# Patient Record
Sex: Male | Born: 1945 | Race: White | Hispanic: No | Marital: Single | Smoking: Never smoker
Health system: Southern US, Community
[De-identification: ages and names within clinical notes are randomized; demographics above are authoritative.]

## PROBLEM LIST (undated history)

## (undated) DIAGNOSIS — I119 Hypertensive heart disease without heart failure: Secondary | ICD-10-CM

## (undated) DIAGNOSIS — I2510 Atherosclerotic heart disease of native coronary artery without angina pectoris: Secondary | ICD-10-CM

## (undated) DIAGNOSIS — I5022 Chronic systolic (congestive) heart failure: Secondary | ICD-10-CM

## (undated) DIAGNOSIS — I639 Cerebral infarction, unspecified: Secondary | ICD-10-CM

## (undated) DIAGNOSIS — E119 Type 2 diabetes mellitus without complications: Secondary | ICD-10-CM

## (undated) DIAGNOSIS — E785 Hyperlipidemia, unspecified: Secondary | ICD-10-CM

## (undated) DIAGNOSIS — I255 Ischemic cardiomyopathy: Secondary | ICD-10-CM

## (undated) DIAGNOSIS — R42 Dizziness and giddiness: Secondary | ICD-10-CM

## (undated) DIAGNOSIS — I10 Essential (primary) hypertension: Secondary | ICD-10-CM

## (undated) HISTORY — PX: CORONARY ANGIOPLASTY WITH STENT PLACEMENT: SHX49

## (undated) HISTORY — DX: Hyperlipidemia, unspecified: E78.5

## (undated) HISTORY — DX: Cerebral infarction, unspecified: I63.9

## (undated) HISTORY — PX: KNEE CARTILAGE SURGERY: SHX688

## (undated) HISTORY — DX: Essential (primary) hypertension: I10

## (undated) HISTORY — PX: KNEE SURGERY: SHX244

---

## 2009-06-12 HISTORY — PX: CARDIAC CATHETERIZATION: SHX172

## 2009-11-11 ENCOUNTER — Inpatient Hospital Stay: Payer: Self-pay | Admitting: Internal Medicine

## 2011-06-13 HISTORY — PX: CARDIAC CATHETERIZATION: SHX172

## 2011-09-16 ENCOUNTER — Inpatient Hospital Stay: Payer: Self-pay | Admitting: Internal Medicine

## 2011-09-16 LAB — COMPREHENSIVE METABOLIC PANEL
Albumin: 4.3 g/dL (ref 3.4–5.0)
Alkaline Phosphatase: 107 U/L (ref 50–136)
Bilirubin,Total: 1 mg/dL (ref 0.2–1.0)
Chloride: 104 mmol/L (ref 98–107)
Co2: 24 mmol/L (ref 21–32)
Creatinine: 1.09 mg/dL (ref 0.60–1.30)
EGFR (Non-African Amer.): >60
Potassium: 3.7 mmol/L (ref 3.5–5.1)
SGOT(AST): 37 U/L (ref 15–37)
Sodium: 140 mmol/L (ref 136–145)
Total Protein: 8 g/dL (ref 6.4–8.2)

## 2011-09-16 LAB — CBC
HCT: 41.3 % (ref 40.0–52.0)
HGB: 13.3 g/dL (ref 13.0–18.0)
MCHC: 32.3 g/dL (ref 32.0–36.0)
MCV: 89 fL (ref 80–100)
Platelet: 154 10*3/uL (ref 150–440)
RDW: 14 % (ref 11.5–14.5)
WBC: 7.7 10*3/uL (ref 3.8–10.6)

## 2011-09-16 LAB — CK TOTAL AND CKMB (NOT AT ARMC)
CK, Total: 136 U/L (ref 35–232)
CK-MB: 3.9 ng/mL — ABNORMAL HIGH (ref 0.5–3.6)
CK-MB: 3.5 ng/mL (ref 0.5–3.6)

## 2011-09-16 LAB — PRO B NATRIURETIC PEPTIDE: B-Type Natriuretic Peptide: 4078 pg/mL — ABNORMAL HIGH (ref 0–125)

## 2011-09-16 LAB — TROPONIN I: Troponin-I: 0.02 ng/mL

## 2011-09-17 DIAGNOSIS — R079 Chest pain, unspecified: Secondary | ICD-10-CM

## 2011-09-17 DIAGNOSIS — I5023 Acute on chronic systolic (congestive) heart failure: Secondary | ICD-10-CM

## 2011-09-17 DIAGNOSIS — I059 Rheumatic mitral valve disease, unspecified: Secondary | ICD-10-CM

## 2011-09-17 LAB — BASIC METABOLIC PANEL
Calcium, Total: 8.9 mg/dL (ref 8.5–10.1)
Co2: 30 mmol/L (ref 21–32)
EGFR (Non-African Amer.): >60
Glucose: 109 mg/dL — ABNORMAL HIGH (ref 65–99)
Potassium: 3.5 mmol/L (ref 3.5–5.1)

## 2011-09-17 LAB — CBC WITH DIFFERENTIAL/PLATELET
Basophil #: 0 10*3/uL (ref 0.0–0.1)
Basophil %: 0.5 %
Eosinophil #: 0.1 10*3/uL (ref 0.0–0.7)
HCT: 34.5 % — ABNORMAL LOW (ref 40.0–52.0)
Lymphocyte #: 1.7 10*3/uL (ref 1.0–3.6)
Lymphocyte %: 27.8 %
MCHC: 32.7 g/dL (ref 32.0–36.0)
MCV: 89 fL (ref 80–100)
Monocyte #: 0.5 10*3/uL (ref 0.0–0.7)
Monocyte %: 8.1 %
Neutrophil #: 3.8 10*3/uL (ref 1.4–6.5)
Neutrophil %: 61.9 %
RDW: 14.8 % — ABNORMAL HIGH (ref 11.5–14.5)

## 2011-09-17 LAB — CK TOTAL AND CKMB (NOT AT ARMC): CK, Total: 132 U/L (ref 35–232)

## 2011-09-17 LAB — LIPID PANEL
Cholesterol: 126 mg/dL (ref 0–200)
HDL Cholesterol: 26 mg/dL — ABNORMAL LOW (ref 40–60)
Triglycerides: 99 mg/dL (ref 0–200)

## 2011-09-17 LAB — TROPONIN I: Troponin-I: 0.03 ng/mL

## 2011-09-17 LAB — MAGNESIUM: Magnesium: 1.9 mg/dL

## 2011-09-18 DIAGNOSIS — I2510 Atherosclerotic heart disease of native coronary artery without angina pectoris: Secondary | ICD-10-CM

## 2011-09-19 LAB — BASIC METABOLIC PANEL
Anion Gap: 9 (ref 7–16)
BUN: 15 mg/dL (ref 7–18)
Calcium, Total: 8.6 mg/dL (ref 8.5–10.1)
Chloride: 104 mmol/L (ref 98–107)
EGFR (African American): >60
EGFR (Non-African Amer.): >60
Osmolality: 283 (ref 275–301)
Potassium: 3.8 mmol/L (ref 3.5–5.1)

## 2011-10-05 ENCOUNTER — Encounter: Payer: Self-pay | Admitting: Cardiovascular Disease

## 2011-10-13 ENCOUNTER — Encounter: Payer: Self-pay | Admitting: Cardiovascular Disease

## 2012-05-20 ENCOUNTER — Emergency Department: Payer: Self-pay | Admitting: Emergency Medicine

## 2012-05-20 LAB — BASIC METABOLIC PANEL
BUN: 14 mg/dL (ref 7–18)
Calcium, Total: 8.9 mg/dL (ref 8.5–10.1)
Chloride: 107 mmol/L (ref 98–107)
Creatinine: 0.93 mg/dL (ref 0.60–1.30)
EGFR (African American): >60
EGFR (Non-African Amer.): >60
Glucose: 202 mg/dL — ABNORMAL HIGH (ref 65–99)
Potassium: 4.1 mmol/L (ref 3.5–5.1)
Sodium: 137 mmol/L (ref 136–145)

## 2012-05-20 LAB — HEPATIC FUNCTION PANEL A (ARMC)
Alkaline Phosphatase: 99 U/L (ref 50–136)
SGPT (ALT): 25 U/L (ref 12–78)

## 2012-05-20 LAB — CBC
HCT: 41.3 % (ref 40.0–52.0)
MCH: 30.7 pg (ref 26.0–34.0)
MCHC: 33.7 g/dL (ref 32.0–36.0)
Platelet: 179 10*3/uL (ref 150–440)
RBC: 4.54 10*6/uL (ref 4.40–5.90)
RDW: 15.4 % — ABNORMAL HIGH (ref 11.5–14.5)

## 2012-05-20 LAB — CK TOTAL AND CKMB (NOT AT ARMC): CK, Total: 130 U/L (ref 35–232)

## 2012-05-20 LAB — TROPONIN I: Troponin-I: 0.03 ng/mL

## 2012-05-27 ENCOUNTER — Inpatient Hospital Stay: Payer: Self-pay | Admitting: Internal Medicine

## 2012-05-27 LAB — CBC
HCT: 43.5 % (ref 40.0–52.0)
HGB: 14.5 g/dL (ref 13.0–18.0)
MCHC: 33.3 g/dL (ref 32.0–36.0)
MCV: 90 fL (ref 80–100)
Platelet: 174 10*3/uL (ref 150–440)

## 2012-05-27 LAB — URINALYSIS, COMPLETE
Bilirubin,UR: NEGATIVE
Glucose,UR: NEGATIVE mg/dL (ref 0–75)
Ketone: NEGATIVE
Protein: 100
RBC,UR: 1 /HPF (ref 0–5)
Specific Gravity: 1.016 (ref 1.003–1.030)
WBC UR: 5 /HPF (ref 0–5)

## 2012-05-27 LAB — COMPREHENSIVE METABOLIC PANEL
Albumin: 3.7 g/dL (ref 3.4–5.0)
Alkaline Phosphatase: 96 U/L (ref 50–136)
BUN: 19 mg/dL — ABNORMAL HIGH (ref 7–18)
Bilirubin,Total: 0.8 mg/dL (ref 0.2–1.0)
Co2: 26 mmol/L (ref 21–32)
Creatinine: 1.04 mg/dL (ref 0.60–1.30)
Glucose: 165 mg/dL — ABNORMAL HIGH (ref 65–99)
Osmolality: 274 (ref 275–301)
Potassium: 3.9 mmol/L (ref 3.5–5.1)
Sodium: 134 mmol/L — ABNORMAL LOW (ref 136–145)
Total Protein: 8 g/dL (ref 6.4–8.2)

## 2012-05-27 LAB — PRO B NATRIURETIC PEPTIDE: B-Type Natriuretic Peptide: 4147 pg/mL — ABNORMAL HIGH (ref 0–125)

## 2012-05-28 DIAGNOSIS — I2510 Atherosclerotic heart disease of native coronary artery without angina pectoris: Secondary | ICD-10-CM

## 2012-05-28 DIAGNOSIS — R0602 Shortness of breath: Secondary | ICD-10-CM

## 2012-05-28 DIAGNOSIS — I517 Cardiomegaly: Secondary | ICD-10-CM

## 2012-05-28 LAB — TROPONIN I: Troponin-I: <0.02 ng/mL

## 2012-05-29 LAB — BASIC METABOLIC PANEL
Anion Gap: 6 — ABNORMAL LOW (ref 7–16)
Calcium, Total: 9 mg/dL (ref 8.5–10.1)
Co2: 29 mmol/L (ref 21–32)
EGFR (African American): >60
Glucose: 132 mg/dL — ABNORMAL HIGH (ref 65–99)
Osmolality: 274 (ref 275–301)

## 2012-06-12 HISTORY — PX: TOTAL HIP ARTHROPLASTY: SHX124

## 2012-07-04 ENCOUNTER — Encounter: Payer: Self-pay | Admitting: Cardiovascular Disease

## 2012-07-04 ENCOUNTER — Ambulatory Visit (INDEPENDENT_AMBULATORY_CARE_PROVIDER_SITE_OTHER): Payer: Self-pay | Admitting: Cardiovascular Disease

## 2012-07-04 VITALS — BP 162/98 | HR 91 | Ht 77.0 in | Wt 307.5 lb

## 2012-07-04 DIAGNOSIS — R05 Cough: Secondary | ICD-10-CM

## 2012-07-04 DIAGNOSIS — I2510 Atherosclerotic heart disease of native coronary artery without angina pectoris: Secondary | ICD-10-CM

## 2012-07-04 DIAGNOSIS — R0789 Other chest pain: Secondary | ICD-10-CM

## 2012-07-04 DIAGNOSIS — R0602 Shortness of breath: Secondary | ICD-10-CM

## 2012-07-04 DIAGNOSIS — I5042 Chronic combined systolic (congestive) and diastolic (congestive) heart failure: Secondary | ICD-10-CM | POA: Insufficient documentation

## 2012-07-04 DIAGNOSIS — R059 Cough, unspecified: Secondary | ICD-10-CM

## 2012-07-04 DIAGNOSIS — I10 Essential (primary) hypertension: Secondary | ICD-10-CM

## 2012-07-04 DIAGNOSIS — I509 Heart failure, unspecified: Secondary | ICD-10-CM

## 2012-07-04 DIAGNOSIS — I5022 Chronic systolic (congestive) heart failure: Secondary | ICD-10-CM

## 2012-07-04 DIAGNOSIS — E785 Hyperlipidemia, unspecified: Secondary | ICD-10-CM

## 2012-07-04 MED ORDER — CARVEDILOL 25 MG PO TABS: 25.0000 mg | ORAL_TABLET | Freq: Two times a day (BID) | ORAL | Status: DC

## 2012-07-04 MED ORDER — LISINOPRIL 40 MG PO TABS: 40.0000 mg | ORAL_TABLET | Freq: Every day | ORAL | Status: DC

## 2012-07-04 MED ORDER — METFORMIN HCL 500 MG PO TABS: 500.0000 mg | ORAL_TABLET | Freq: Two times a day (BID) | ORAL | Status: DC

## 2012-07-04 MED ORDER — FUROSEMIDE 40 MG PO TABS: 80.0000 mg | ORAL_TABLET | Freq: Two times a day (BID) | ORAL | Status: DC

## 2012-07-04 NOTE — Assessment & Plan Note (Signed)
Currently with no symptoms of angina. No further workup at this time. Continue current medication regimen. 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

## 2012-07-04 NOTE — Patient Instructions (Addendum)
Please take afternoon lasix at 2 pm Ok to take lasix 80 mg in the AM, extra lasix in the afternoon  Double coreg and take 25 mg twice a day  Increase lisinopril to 40 mg daily  Call if the cough does not improve  Please call us if you have new issues that need to be addressed before your next appt.  Your physician wants you to follow-up in: 3 months.  You will receive a reminder letter in the mail two months in advance. If you don't receive a letter, please call our office to schedule the follow-up appointment.

## 2012-07-04 NOTE — Progress Notes (Signed)
Patient ID: [name], male    DOB: August 28, 1945, 68 y.o.   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 Prior echocardiogram showing ejection fraction less than 25%. Poorly controlled diabetes  He had presented to the emergency room one week earlier for shortness of breath malaise. Started on Lasix. He reports no benefit to the [place] because he earns too much. He had significant diuresis in the hospital, started back on beta blocker and ACE inhibitor With improvement of his symptoms. He was also started on antibiotics for possible chronic bronchitis.   He presents today and reports that he continues to feel poorly. He has a persistent cough, white phlegm .He denies any edema or abdominal swelling. He has general malaise. Significant stress as his mother had a recent fall with severe leg fracture.) Close friend has lung cancer  BNP in the hospital was 4100 Echocardiogram in the hospital 05/28/2012 shows ejection fraction 15%, moderate LVH, severe global hypokinesis, moderately dilated left atrium  EKG shows normal sinus rhythm with rate 81 beats per minute no significant ST or T wave changes    Outpatient Encounter Prescriptions as of 07/04/2012  Medication Sig Dispense Refill  . aspirin 81 MG tablet Take 81 mg by mouth daily.      Marland Kitchen glimepiride (AMARYL) 4 MG tablet Take 4 mg by mouth daily before breakfast.      . linagliptin  (TRADJENTA) 5 MG TABS tablet Take 5 mg by mouth daily.      . potassium chloride (K-DUR) 10 MEQ tablet Take 10 mEq by mouth daily.      . pravastatin (PRAVACHOL) 20 MG tablet Take 20 mg by mouth daily.      . carvedilol (COREG) 3.125 MG tablet Take 12.5 mg by mouth 2 (two) times daily with a meal.       .  furosemide (LASIX) 80 MG tablet Take 40 mg by mouth 2 (two) times daily.      Marland Kitchen  lisinopril (PRINIVIL,ZESTRIL) 2.5 MG tablet Take 10 mg by mouth daily.       . metFORMIN (GLUCOPHAGE) 500 MG tablet Take 1 tablet (500 mg total) by mouth 2 (two) times daily with a meal.  60 tablet  11    Review of Systems  Constitutional: Positive for fatigue.  HENT: Negative.   Eyes: Negative.   Respiratory: Positive for shortness of breath.   Cardiovascular: Negative.   Gastrointestinal: Negative.   Musculoskeletal: Negative.   Skin: Negative.   Neurological: Positive for weakness.  Hematological: Negative.   Psychiatric/Behavioral: Negative.   All other systems reviewed and are negative.     BP 162/98  Pulse 91  Ht 6\' 5"  (1.956 m)  Wt 307 lb 8 oz (139.481 kg)  BMI 36.46 kg/m2  Physical Exam  Nursing note and vitals reviewed. Constitutional: He is oriented to person, place, and time. He appears well-developed and well-nourished.  HENT:  Head: Normocephalic.  Nose: Nose normal.  Mouth/Throat: Oropharynx is clear and moist.  Eyes: Conjunctivae normal are normal.  Pupils are equal, round, and reactive to light.  Neck: Normal range of motion. Neck supple. No JVD present.  Cardiovascular: Normal rate, regular rhythm, S1 normal, S2 normal, normal heart sounds and intact distal pulses.  Exam reveals no gallop and no friction rub.   No murmur heard. Pulmonary/Chest: Effort normal and breath sounds normal. No respiratory distress. He has no wheezes. He has no rales. He exhibits no tenderness.  Abdominal: Soft. Bowel sounds are normal. He exhibits no distension. There is no tenderness.    Musculoskeletal: Normal range of motion. He exhibits no edema and no tenderness.  Lymphadenopathy:    He has no cervical adenopathy.  Neurological: He is alert and oriented to person, place, and time. Coordination normal.  Skin: Skin is warm and dry. No rash noted. No erythema.  Psychiatric: He has a normal mood and affect. His behavior is normal. Judgment and thought content normal.           Assessment and Plan

## 2012-07-04 NOTE — Assessment & Plan Note (Signed)
Symptomatic shortness of breath. Continued cough concerning for heart failure. We have suggested he increase Lasix to 80 mg in the morning and 40 mg in the early afternoon. If no significant improvement in symptoms, we have recommended he take Lasix 80 mg twice a day. If he continues to have symptoms after that, other option will be to change to torsemide 40 mg twice a day.   We'll increase his carvedilol to 25 mg twice a day. Heart rate is mildly elevated. Blood pressures high. We'll also increase lisinopril to 40 mg daily.

## 2012-07-04 NOTE — Assessment & Plan Note (Signed)
Shortness of breath likely from heart failure. No smoking history. He is overweight and deconditioned.

## 2012-07-04 NOTE — Assessment & Plan Note (Signed)
Uncertain if cough is secondary to heart failure. If no improvement in cough with diuresis, may need to do a trial hold off the ACE inhibitor.

## 2012-07-04 NOTE — Assessment & Plan Note (Signed)
Carvedilol increased and lisinopril increased for better blood pressure control. Increase diuresis should also help blood pressure.

## 2012-07-05 ENCOUNTER — Encounter: Payer: Self-pay | Admitting: *Deleted

## 2012-10-02 ENCOUNTER — Ambulatory Visit (INDEPENDENT_AMBULATORY_CARE_PROVIDER_SITE_OTHER): Payer: Medicare Other | Admitting: Cardiovascular Disease

## 2012-10-02 ENCOUNTER — Encounter: Payer: Self-pay | Admitting: Cardiovascular Disease

## 2012-10-02 VITALS — BP 169/108 | HR 69 | Ht 77.0 in | Wt 311.0 lb

## 2012-10-02 DIAGNOSIS — I2510 Atherosclerotic heart disease of native coronary artery without angina pectoris: Secondary | ICD-10-CM

## 2012-10-02 DIAGNOSIS — I10 Essential (primary) hypertension: Secondary | ICD-10-CM

## 2012-10-02 DIAGNOSIS — R42 Dizziness and giddiness: Secondary | ICD-10-CM

## 2012-10-02 DIAGNOSIS — I5022 Chronic systolic (congestive) heart failure: Secondary | ICD-10-CM

## 2012-10-02 DIAGNOSIS — I509 Heart failure, unspecified: Secondary | ICD-10-CM

## 2012-10-02 NOTE — Patient Instructions (Addendum)
You are doing well. No medication changes were made.  Please monitor you weight If you get worsening shortness of breath or edema, cut back on fluid intake, increase the diuretic  Please call us if you have new issues that need to be addressed before your next appt.  Your physician wants you to follow-up in: 6 months.  You will receive a reminder letter in the mail two months in advance. If you don't receive a letter, please call our office to schedule the follow-up appointment.

## 2012-10-02 NOTE — Progress Notes (Signed)
Patient ID: [name], male    DOB: 09-10-1945, 67 y.o.   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 Prior echocardiogram showing ejection fraction less than 25%. Poorly controlled diabetes  He had presented to the emergency room previously  for shortness of breath malaise. Started on Lasix. He reports no benefit to the [place] because he earns too much. He had significant diuresis in the hospital, started back on beta blocker and ACE inhibitor With improvement of his symptoms. He was also started on antibiotics for possible chronic bronchitis.   He reports significant stress recently. Mother passed away from complications after a fall, leg fracture and infection in her abdomen. Friends died of lung cancer. He continues to work, drinking more during the daytime. Weight is up several pounds but no significant edema and shortness of breath is stable.  Overall is trying to adjust to loss of his mother.  BNP in the hospital was 4100 Echocardiogram in the hospital 05/28/2012 shows ejection fraction 15%, moderate LVH, severe global hypokinesis, moderately dilated left atrium  EKG shows normal sinus rhythm with rate 69 beats per minute no significant ST or T wave changes    Outpatient Encounter Prescriptions as of 10/02/2012  Medication Sig Dispense Refill  . aspirin 81 MG tablet Take 81 mg by mouth daily.      . carvedilol (COREG)  25 MG tablet Take 1 tablet (25 mg total) by mouth 2 (two) times daily with a meal.  180 tablet  3  . furosemide (LASIX) 40 MG tablet Take 2 tablets (80 mg total) by mouth 2 (two) times daily.  360 tablet  3  . glimepiride (AMARYL) 4 MG tablet Take 4 mg by mouth daily before breakfast.      . linagliptin (TRADJENTA) 5 MG TABS tablet Take 5 mg by mouth daily.      Marland Kitchen lisinopril (PRINIVIL,ZESTRIL) 40 MG tablet Take 1 tablet (40 mg total) by mouth daily.  90 tablet  3  . metFORMIN (GLUCOPHAGE) 500 MG tablet Take 1 tablet (500 mg total) by mouth 2 (two) times daily with a meal.  60 tablet  11  . potassium chloride (K-DUR) 10 MEQ tablet Take 10 mEq by mouth daily.      . pravastatin (PRAVACHOL) 20 MG tablet Take 20 mg by mouth daily.       No facility-administered encounter medications on file as of 10/02/2012.     Review of Systems  HENT: Negative.   Eyes: Negative.   Cardiovascular: Negative.   Gastrointestinal: Negative.   Musculoskeletal: Negative.   Skin: Negative.   Psychiatric/Behavioral: Positive for dysphoric mood.  All other systems reviewed and are negative.     BP 169/108  Pulse 69  Ht 6\' 5"  (1.956 m)  Wt 311 lb (141.069 kg)  BMI 36.87 kg/m2  Physical Exam  Nursing note and vitals reviewed. Constitutional: He is oriented to person, place, and time. He appears well-developed and well-nourished.  HENT:  Head: Normocephalic.  Nose: Nose normal.  Mouth/Throat: Oropharynx  is clear and moist.  Eyes: Conjunctivae are normal. Pupils are equal, round, and reactive to light.  Neck: Normal range of motion. Neck supple. No JVD present.  Cardiovascular: Normal rate, regular rhythm, S1 normal, S2 normal, normal heart sounds and intact distal pulses.  Exam reveals no gallop and no friction rub.   No murmur heard. Pulmonary/Chest: Effort normal and breath sounds normal. No respiratory distress. He has no wheezes. He has no rales. He exhibits no tenderness.  Abdominal: Soft. Bowel  sounds are normal. He exhibits no distension. There is no tenderness.  Musculoskeletal: Normal range of motion. He exhibits no edema and no tenderness.  Lymphadenopathy:    He has no cervical adenopathy.  Neurological: He is alert and oriented to person, place, and time. Coordination normal.  Skin: Skin is warm and dry. No rash noted. No erythema.  Psychiatric: He has a normal mood and affect. His behavior is normal. Judgment and thought content normal.      Assessment and Plan

## 2012-10-02 NOTE — Assessment & Plan Note (Signed)
Currently with no symptoms of angina. No further workup at this time. Continue current medication regimen. 

## 2012-10-02 NOTE — Assessment & Plan Note (Signed)
Weight is essentially stable. We have suggested he stay on his aggressive diuretic regimen, limit his fluid intake. He is drinking significant beer.

## 2012-10-02 NOTE — Assessment & Plan Note (Signed)
Blood pressure is high but he suspects this is from recent stressors. He is tearful at times in the office today after loss of his mother. We will continue to monitor his blood pressure closely.

## 2012-10-14 ENCOUNTER — Telehealth: Payer: Self-pay

## 2012-10-14 NOTE — Telephone Encounter (Signed)
Pt cleared

## 2012-10-14 NOTE — Telephone Encounter (Signed)
Pt is having hip replacement in the next couple weeks, and needs surgical clearance. Pt was seen last on 10/02/2012. Please call

## 2012-10-15 NOTE — Telephone Encounter (Signed)
Acceptable risk Would minimized IVF in periop and post op period as EF is less than 20% May be best to involve cardiology post op

## 2012-10-16 NOTE — Telephone Encounter (Signed)
Pt informed letter will be placed at [facility] for pick up

## 2013-04-02 ENCOUNTER — Ambulatory Visit: Payer: Medicare Other | Admitting: Cardiovascular Disease

## 2013-04-07 ENCOUNTER — Ambulatory Visit (INDEPENDENT_AMBULATORY_CARE_PROVIDER_SITE_OTHER): Payer: Medicare Other | Admitting: Cardiovascular Disease

## 2013-04-07 ENCOUNTER — Encounter: Payer: Self-pay | Admitting: Cardiovascular Disease

## 2013-04-07 VITALS — BP 190/116 | HR 98 | Ht 77.0 in | Wt 304.5 lb

## 2013-04-07 DIAGNOSIS — I2510 Atherosclerotic heart disease of native coronary artery without angina pectoris: Secondary | ICD-10-CM

## 2013-04-07 DIAGNOSIS — I10 Essential (primary) hypertension: Secondary | ICD-10-CM

## 2013-04-07 DIAGNOSIS — I509 Heart failure, unspecified: Secondary | ICD-10-CM

## 2013-04-07 DIAGNOSIS — E785 Hyperlipidemia, unspecified: Secondary | ICD-10-CM

## 2013-04-07 DIAGNOSIS — I5022 Chronic systolic (congestive) heart failure: Secondary | ICD-10-CM

## 2013-04-07 DIAGNOSIS — F1010 Alcohol abuse, uncomplicated: Secondary | ICD-10-CM

## 2013-04-07 MED ORDER — PRAVASTATIN SODIUM 40 MG PO TABS: 40.0000 mg | ORAL_TABLET | Freq: Every day | ORAL | Status: DC

## 2013-04-07 MED ORDER — AMLODIPINE BESYLATE 10 MG PO TABS: 10.0000 mg | ORAL_TABLET | Freq: Every day | ORAL | Status: DC

## 2013-04-07 NOTE — Assessment & Plan Note (Signed)
Currently with no symptoms of angina. No further workup at this time. Continue current medication regimen. 

## 2013-04-07 NOTE — Assessment & Plan Note (Signed)
In remission for the past month

## 2013-04-07 NOTE — Assessment & Plan Note (Addendum)
Blood pressure is elevated today, 180 systolic on my recheck. We will start amlodipine 10 mg daily. Continue Coreg and lisinopril.

## 2013-04-07 NOTE — Progress Notes (Signed)
Patient ID: [name], male    DOB: 20-Feb-1946, 67 y.o.   MRN: [id number]  HPI Comments: 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 Prior echocardiogram showing ejection fraction less than 25%. Poorly controlled diabetes in the past Reports today that he has not been drinking in the past month. 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  presented to the emergency room previously  for shortness of breath malaise. Started on Lasix. He reports no benefits at the [place] because he earns too much. He had significant diuresis in the hospital, started back on beta blocker and ACE inhibitor With improvement of his symptoms.   He reports significant stress recently. Mother passed away from complications after a fall, leg fracture and infection in her abdomen. Friend died of lung cancer. He has had a significant problem with drinking in the past, has not had a drink in the last one month Doing well after his total hip replacement Blood pressure is up today, he does not check it at home. Recent lab work showing hemoglobin A1c 6.5, total cholesterol 231, LDL 160  BNP in the hospital was 4100 Echocardiogram in the hospital 05/28/2012 shows ejection fraction 15%, moderate LVH, severe global hypokinesis, moderately dilated left atrium (presumably depressed from alcohol)   EKG shows normal sinus rhythm with rate 98 beats per minute no significant ST or T wave changes    Outpatient Encounter Prescriptions as of 04/07/2013  Medication Sig Dispense Refill  . aspirin 81  MG tablet Take 81 mg by mouth daily.      . carvedilol (COREG) 25 MG tablet Take 1 tablet (25 mg total) by mouth 2 (two) times daily with a meal.  180 tablet  3  . furosemide (LASIX) 40 MG tablet Take 2 tablets (80 mg total) by mouth 2 (two) times daily.  360 tablet  3  . glimepiride (AMARYL) 4 MG tablet Take 4 mg by mouth daily before breakfast.      . HYDROcodone-acetaminophen (NORCO) 10-325 MG per tablet Take 1 tablet by mouth daily.       Marland Kitchen linagliptin (TRADJENTA) 5 MG TABS tablet Take 5 mg by mouth daily.      Marland Kitchen lisinopril (PRINIVIL,ZESTRIL) 40 MG tablet Take 1 tablet (40 mg total) by mouth daily.  90 tablet  3  . metFORMIN (GLUCOPHAGE) 500 MG tablet Take 1 tablet (500 mg total) by mouth 2 (two) times daily with a meal.  60 tablet  11  . potassium chloride (K-DUR) 10 MEQ tablet Take 10 mEq by mouth daily.      . pravastatin (PRAVACHOL) 20 MG tablet Take 20 mg by mouth daily.       No facility-administered encounter medications on file as of 04/07/2013.     Review of Systems  Constitutional: Negative.   HENT: Negative.   Eyes: Negative.   Respiratory: Negative.   Cardiovascular: Negative.   Gastrointestinal: Negative.   Endocrine: Negative.   Musculoskeletal: Negative.   Skin: Negative.   Allergic/Immunologic: Negative.   Neurological: Negative.   Hematological: Negative.   Psychiatric/Behavioral: Positive for dysphoric mood.  All other systems reviewed and  are negative.     BP 190/116  Pulse 98  Ht 6\' 5"  (1.956 m)  Wt 304 lb 8 oz (138.12 kg)  BMI 36.1 kg/m2  Physical Exam  Nursing note and vitals reviewed. Constitutional: He is oriented to person, place, and time. He appears well-developed and well-nourished.  HENT:  Head: Normocephalic.  Nose: Nose normal.  Mouth/Throat: Oropharynx is clear and moist.  Eyes: Conjunctivae are normal. Pupils are equal, round, and reactive to light.  Neck: Normal range of motion. Neck supple. No JVD present.  Cardiovascular: Normal  rate, regular rhythm, S1 normal, S2 normal, normal heart sounds and intact distal pulses.  Exam reveals no gallop and no friction rub.   No murmur heard. Pulmonary/Chest: Effort normal and breath sounds normal. No respiratory distress. He has no wheezes. He has no rales. He exhibits no tenderness.  Abdominal: Soft. Bowel sounds are normal. He exhibits no distension. There is no tenderness.  Musculoskeletal: Normal range of motion. He exhibits no edema and no tenderness.  Lymphadenopathy:    He has no cervical adenopathy.  Neurological: He is alert and oriented to person, place, and time. Coordination normal.  Skin: Skin is warm and dry. No rash noted. No erythema.  Psychiatric: He has a normal mood and affect. His behavior is normal. Judgment and thought content normal.      Assessment and Plan

## 2013-04-07 NOTE — Assessment & Plan Note (Signed)
Encouraged him to stay on his diuretics and other medications. Appears relatively euvolemic

## 2013-04-07 NOTE — Patient Instructions (Addendum)
Blood pressure is elevated today  Please make sure you are taking: lisinopril 40 mg one a day Take coreg 25 mg twice a day  Please start amlodipine 10 mg once a day Please monitor your blood pressure  Please continue on your pravastatin  Please call us if you have new issues that need to be addressed before your next appt.  Your physician wants you to follow-up in: 2 months.

## 2013-04-07 NOTE — Assessment & Plan Note (Signed)
We will increase his pravastatin to 40 mg daily. Cholesterol well above goal. Goal LDL less than 70.

## 2013-06-10 ENCOUNTER — Ambulatory Visit: Payer: Medicare Other | Admitting: Cardiovascular Disease

## 2013-06-12 HISTORY — PX: LASIK: SHX215

## 2013-06-23 ENCOUNTER — Ambulatory Visit: Payer: Medicare Other | Admitting: Cardiovascular Disease

## 2013-08-06 ENCOUNTER — Encounter: Payer: Self-pay | Admitting: Cardiovascular Disease

## 2013-08-06 ENCOUNTER — Ambulatory Visit: Payer: Medicare Other | Admitting: Physician Assistant

## 2013-08-06 ENCOUNTER — Ambulatory Visit (INDEPENDENT_AMBULATORY_CARE_PROVIDER_SITE_OTHER): Payer: Medicare Other | Admitting: Cardiovascular Disease

## 2013-08-06 VITALS — BP 152/100 | HR 105 | Ht 77.0 in | Wt 312.5 lb

## 2013-08-06 DIAGNOSIS — I2510 Atherosclerotic heart disease of native coronary artery without angina pectoris: Secondary | ICD-10-CM

## 2013-08-06 DIAGNOSIS — I5022 Chronic systolic (congestive) heart failure: Secondary | ICD-10-CM

## 2013-08-06 DIAGNOSIS — R0602 Shortness of breath: Secondary | ICD-10-CM

## 2013-08-06 DIAGNOSIS — I509 Heart failure, unspecified: Secondary | ICD-10-CM

## 2013-08-06 DIAGNOSIS — E785 Hyperlipidemia, unspecified: Secondary | ICD-10-CM

## 2013-08-06 DIAGNOSIS — M549 Dorsalgia, unspecified: Secondary | ICD-10-CM

## 2013-08-06 DIAGNOSIS — F1010 Alcohol abuse, uncomplicated: Secondary | ICD-10-CM

## 2013-08-06 DIAGNOSIS — I10 Essential (primary) hypertension: Secondary | ICD-10-CM

## 2013-08-06 MED ORDER — CARVEDILOL 25 MG PO TABS: 25.0000 mg | ORAL_TABLET | Freq: Two times a day (BID) | ORAL | Status: DC

## 2013-08-06 MED ORDER — LISINOPRIL 40 MG PO TABS: 40.0000 mg | ORAL_TABLET | Freq: Every day | ORAL | Status: DC

## 2013-08-06 MED ORDER — PRAVASTATIN SODIUM 40 MG PO TABS: 40.0000 mg | ORAL_TABLET | Freq: Every day | ORAL | Status: DC

## 2013-08-06 MED ORDER — CYCLOBENZAPRINE HCL 10 MG PO TABS
10.0000 mg | ORAL_TABLET | Freq: Three times a day (TID) | ORAL | Status: DC | PRN
Start: 2013-08-06 — End: 2014-03-16

## 2013-08-06 NOTE — Assessment & Plan Note (Signed)
Blood pressure very elevated. Has not been taking his medications. We will   re\represcribe all his treat his medications

## 2013-08-06 NOTE — Progress Notes (Signed)
Patient ID: Thomas FillersGary Bender, male    DOB: 09/21/1945, 68 y.o.   MRN: [id number]  HPI Comments: 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 Prior echocardiogram showing ejection fraction less than 25%. Poorly controlled diabetes in the past s/p total hip replacement on left   presented to the emergency room previously  for shortness of breath malaise. Started on Lasix. [place] because [place] too much. He had significant diuresis in the hospital, started back on beta blocker and ACE inhibitor With improvement of his symptoms.   Previous significant stress. Mother passed away from complications after a fall, leg fracture and infection in her abdomen. Friend died of lung cancer. He has had a significant problem with drinking in the past,    In followup today, he reports that last week he fell and hurt his back and he has had significant pain. His back is in spasm today. He did go to see the chiropractor today. He is having trouble sitting on the exam table without discomfort. Also reports that he has run out of money. Receive social security check today. He has not been taking his medications on a regular basis  Previous lab work showing hemoglobin A1c 6.5, total cholesterol 231, LDL 160  BNP in the hospital was 4100 Echocardiogram in the hospital 05/28/2012 shows ejection fraction 15%, moderate LVH, severe global hypokinesis, moderately dilated left atrium (presumably depressed from alcohol)   EKG shows normal sinus rhythm with rate 105 beats  per minute no significant ST or T wave changes    Outpatient Encounter Prescriptions as of 08/06/2013  Medication Sig  . amLODipine (NORVASC) 10 MG tablet Take 1 tablet (10 mg total) by mouth daily.  Marland Kitchen. aspirin 81 MG tablet Take 81 mg by mouth daily.  . carvedilol (COREG) 25 MG tablet Take 1 tablet (25 mg total) by mouth 2 (two) times daily with a meal.  . furosemide (LASIX) 40 MG tablet Take 2 tablets (80 mg total) by mouth 2 (two) times daily.  Marland Kitchen. glimepiride (AMARYL) 4 MG tablet Take 4 mg by mouth daily before breakfast.  . HYDROcodone-acetaminophen (NORCO) 10-325 MG per tablet Take 1 tablet by mouth daily.   Marland Kitchen. linagliptin (TRADJENTA) 5 MG TABS tablet Take 5 mg by mouth daily.  Marland Kitchen. lisinopril (PRINIVIL,ZESTRIL) 40 MG tablet Take 1 tablet (40 mg total) by mouth daily.  . metFORMIN (GLUCOPHAGE) 500 MG tablet Take 1 tablet (500 mg total) by mouth 2 (two) times daily with a meal.  . potassium chloride (K-DUR) 10 MEQ tablet Take 10 mEq by mouth daily.  . pravastatin (PRAVACHOL) 40 MG tablet Take 1 tablet (40 mg total) by mouth daily.     Review of Systems  Constitutional: Negative.   HENT: Negative.   Eyes: Negative.   Respiratory: Negative.   Cardiovascular: Negative.   Gastrointestinal: Negative.   Endocrine: Negative.   Musculoskeletal: Positive for back pain.  Skin: Negative.   Allergic/Immunologic: Negative.   Neurological: Negative.   Hematological: Negative.   Psychiatric/Behavioral: Positive for dysphoric mood.  All other systems reviewed and are negative.    BP 152/100  Pulse 105  Ht 6\' 5"  (1.956 m)  Wt 312 lb 8 oz (141.749 kg)  BMI 37.05 kg/m2  Physical Exam  Nursing note and vitals reviewed. Constitutional: He is oriented to person, place, and time. He appears well-developed and well-nourished.  Minimal range of motion given his severe back pain. Having spasm on the table.  HENT:  Head: Normocephalic.  Nose: Nose normal.  Mouth/Throat: Oropharynx is clear and  moist.  Eyes: Conjunctivae are normal. Pupils are equal, round, and reactive to light.  Neck: Normal range of motion. Neck supple. No JVD present.  Cardiovascular: Normal rate, regular rhythm, S1 normal, S2 normal, normal heart sounds and intact distal pulses.  Exam reveals no gallop and no friction rub.   No murmur heard. Pulmonary/Chest: Effort normal and breath sounds normal. No respiratory distress. He has no wheezes. He has no rales. He exhibits no tenderness.  Abdominal: Soft. Bowel sounds are normal. He exhibits no distension. There is no tenderness.  Musculoskeletal: He exhibits no edema and no tenderness.  Lymphadenopathy:    He has no cervical adenopathy.  Neurological: He is alert and oriented to person, place, and time. Coordination normal.  Skin: Skin is warm and dry. No rash noted. No erythema.  Psychiatric: Judgment and thought content normal.      Assessment and Plan

## 2013-08-06 NOTE — Assessment & Plan Note (Signed)
Alcohol abuse not discussed with him today

## 2013-08-06 NOTE — Assessment & Plan Note (Signed)
Currently with no symptoms of angina. No further workup at this time. Continue previous medication regiment We will restart his medications as he's not taking anything

## 2013-08-06 NOTE — Patient Instructions (Addendum)
For back pain: Take naproxen 500 mg 1 to 2 pills twice a day as needed for inflammation Take flexeril 10 mg as needed up to three times a day  As needed for muscle relaxer Ok to take tramadol twice a day for pain  For blood mpressure: Take coreg 25 mg twice a day Also take lisinopril once a day Cut the amlodipine in 1/2 if you have headache  Restart aspirin  Please call us if you have new issues that need to be addressed before your next appt.  Your physician wants you to follow-up in: 6 months.  You will receive a reminder letter in the mail two months in advance. If you don't receive a letter, please call our office to schedule the follow-up appointment.

## 2013-08-06 NOTE — Assessment & Plan Note (Signed)
We have offered Flexeril and tramadol for his discomfort. Suggested he followup closely with primary care

## 2013-08-06 NOTE — Assessment & Plan Note (Signed)
Appears to be relatively euvolemic. Currently not taking medications. We'll restart his previous outpatient medications He does not want a diuretic

## 2013-08-06 NOTE — Telephone Encounter (Signed)
This encounter was created in error - please disregard.

## 2013-08-06 NOTE — Assessment & Plan Note (Signed)
Suggested he restart his statin

## 2013-08-13 ENCOUNTER — Ambulatory Visit: Payer: Self-pay | Admitting: Family Medicine

## 2014-01-29 ENCOUNTER — Emergency Department: Payer: Self-pay | Admitting: Emergency Medicine

## 2014-02-27 ENCOUNTER — Ambulatory Visit: Payer: Medicare Other | Admitting: Cardiovascular Disease

## 2014-03-16 ENCOUNTER — Ambulatory Visit (INDEPENDENT_AMBULATORY_CARE_PROVIDER_SITE_OTHER): Payer: Medicare Other | Admitting: Cardiovascular Disease

## 2014-03-16 ENCOUNTER — Encounter: Payer: Self-pay | Admitting: Cardiovascular Disease

## 2014-03-16 VITALS — BP 190/104 | HR 110 | Ht 77.0 in | Wt 317.5 lb

## 2014-03-16 DIAGNOSIS — I2510 Atherosclerotic heart disease of native coronary artery without angina pectoris: Secondary | ICD-10-CM

## 2014-03-16 DIAGNOSIS — I5022 Chronic systolic (congestive) heart failure: Secondary | ICD-10-CM

## 2014-03-16 DIAGNOSIS — M5489 Other dorsalgia: Secondary | ICD-10-CM

## 2014-03-16 DIAGNOSIS — I10 Essential (primary) hypertension: Secondary | ICD-10-CM

## 2014-03-16 DIAGNOSIS — R0602 Shortness of breath: Secondary | ICD-10-CM

## 2014-03-16 DIAGNOSIS — E785 Hyperlipidemia, unspecified: Secondary | ICD-10-CM

## 2014-03-16 MED ORDER — AMLODIPINE BESYLATE 10 MG PO TABS: 10.0000 mg | ORAL_TABLET | Freq: Every day | ORAL | Status: AC

## 2014-03-16 MED ORDER — CARVEDILOL 25 MG PO TABS: 25.0000 mg | ORAL_TABLET | Freq: Two times a day (BID) | ORAL | Status: DC

## 2014-03-16 MED ORDER — LISINOPRIL 40 MG PO TABS: 40.0000 mg | ORAL_TABLET | Freq: Every day | ORAL | Status: DC

## 2014-03-16 MED ORDER — PRAVASTATIN SODIUM 40 MG PO TABS: 40.0000 mg | ORAL_TABLET | Freq: Every day | ORAL | Status: DC

## 2014-03-16 MED ORDER — FUROSEMIDE 40 MG PO TABS: 80.0000 mg | ORAL_TABLET | Freq: Two times a day (BID) | ORAL | Status: DC | PRN

## 2014-03-16 MED ORDER — POTASSIUM CHLORIDE ER 10 MEQ PO TBCR: 10.0000 meq | EXTENDED_RELEASE_TABLET | Freq: Two times a day (BID) | ORAL | Status: DC | PRN
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
## 2014-03-16 NOTE — Assessment & Plan Note (Signed)
Recommended he restart his Lasix for weight gain and shortness of breath. Take with potassium

## 2014-03-16 NOTE — Patient Instructions (Signed)
You are doing well.  For blood pressure Please restart amlodipine one a day, Restart coreg 25 mg twice a day Stay on lisinopril one a day  Please restart pravastatin one a day for cholesterol  Please call us if you have new issues that need to be addressed before your next appt.  Your physician wants you to follow-up in: 6 months.  You will receive a reminder letter in the mail two months in advance. If you don't receive a letter, please call our office to schedule the follow-up appointment.

## 2014-03-16 NOTE — Assessment & Plan Note (Signed)
Currently with no symptoms of angina. No further workup at this time. Continue current medication regimen. 
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
## 2014-03-16 NOTE — Assessment & Plan Note (Signed)
We have recommended that he restart his amlodipine, carvedilol and stay on lisinopril. He did not take his lisinopril today. All of his medications have been refilled

## 2014-03-16 NOTE — Assessment & Plan Note (Signed)
Overall appears relatively euvolemic though does report some shortness of breath when supine. We will renew his Lasix and recommended he take this for weight gain or symptoms of shortness of breath

## 2014-03-16 NOTE — Assessment & Plan Note (Signed)
We have recommended he restart his pravastatin.

## 2014-07-13 DIAGNOSIS — H43812 Vitreous degeneration, left eye: Secondary | ICD-10-CM | POA: Diagnosis not present

## 2014-07-13 DIAGNOSIS — Z961 Presence of intraocular lens: Secondary | ICD-10-CM | POA: Diagnosis not present

## 2014-08-31 DIAGNOSIS — E119 Type 2 diabetes mellitus without complications: Secondary | ICD-10-CM | POA: Diagnosis not present

## 2014-08-31 DIAGNOSIS — H43812 Vitreous degeneration, left eye: Secondary | ICD-10-CM | POA: Diagnosis not present

## 2014-08-31 DIAGNOSIS — Z961 Presence of intraocular lens: Secondary | ICD-10-CM | POA: Diagnosis not present

## 2014-09-08 DIAGNOSIS — M7651 Patellar tendinitis, right knee: Secondary | ICD-10-CM | POA: Diagnosis not present

## 2014-09-08 DIAGNOSIS — M1711 Unilateral primary osteoarthritis, right knee: Secondary | ICD-10-CM | POA: Diagnosis not present
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
## 2014-10-02 NOTE — Discharge Summary (Signed)
PATIENT NAME:  [name], [name] MR#:  [id number] DATE OF BIRTH:  [birth year]  DATE OF ADMISSION:  05/27/2012 DATE OF DISCHARGE:  05/29/2012  PRIMARY CARE PHYSICIAN: None.   DISCHARGE DIAGNOSES: Acute on chronic systolic congestive heart failure, acute bronchitis.   HISTORY OF PRESENTING ILLNESS: A 69 year old male with past medical history of coronary artery disease, 60% blockage in the left LAD, severe congestive heart failure, ejection fraction 25% as per previous records. He has no primary care physician because of financial issues and he cannot afford to go to any doctor. He was in the Emergency Room 1 week ago of this admission with a similar complaint. The emergency physician gave him prescription for the medications which are cheap, available in the local pharmacies and which can help him in CHF. He claims that he took the medication regularly as it was prescribed for the last 5 to 6 days, but there was no improvement in his symptoms. He has to keep sitting in bed all the time. If he lies down, he feels like drowning in water within 5 to 10 minutes. He denied any chest pain with feeling severe shortness of breath. Denied any wheezing and had minimal swelling of the legs, so he was admitted with the possibility of having worsening of his heart failure.   HOSPITAL COURSE AND STAY: His congestive heart failure was managed with IV Lasix, Coreg, lisinopril, spironolactone and serial troponin was followed and echocardiogram was done for further workup. After treatment with IV Lasix, he felt a little better, and finally, echocardiogram was done which showed ejection fraction of 15%. The plan was to discharge him the next day of admission, but because of the finding on echocardiogram and he was still feeling a little short of breath, so decided to keep him 1 more day in the hospital for total improvement. Cardiology consult was done for optimization of the medication, and they suggested possibility of needing  AICD in the future, but before that, he has to take 2 to 3 months of proper medications to optimize his cardiac status.   OTHER MEDICAL ISSUES ADDRESSED DURING THE HOSPITAL STAY:  1. Hypertension. We gave him Coreg, lisinopril and spirolactone, and blood pressure remained under control.  2. Diabetes mellitus. We kept him on insulin sliding scale. We stopped metformin because of CHF, and we gave a prescription for glipizide.  3. Hyperlipidemia. Gave him Pravachol.  4. He was also complaining of orthostasis as when he tries to get up suddenly, he feels a little dizzy, but when he stands in the same position for 1 to 2 seconds, after that he feels okay, so we found out that this was because of the diuresis. He had more than 2 liter diuresis in 1 day, and blood pressure was borderline low. I explained to him that he needs all of his medications because of very severe heart failure, and he understands and agreed for getting up slowly from lying down position and keep holding something until he feels okay.   IMPORTANT LAB RESULTS: On presentation, glucose 165, BUN 19, creatinine 1.04, sodium 134, potassium 3.9, chloride 101, CO2 26. WBC 11.2, hemoglobin 14.5, platelet count 174. Troponin less than 0.02. BNP 4147. Echocardiogram result summary: Left ventricular systolic function is severely reduced. Ejection fraction estimated to be 15%. Transmitral spectrum Doppler flow pattern is suggestive of impaired LV relaxation. There is moderate concentric left ventricular hypertrophy. Severe global hypokinesis of the left ventricle. The right ventricle systolic function is normal. The left atrium  is moderately dilated, and right ventricle systolic pressure is normal  CONDITION ON DISCHARGE: Stable.   MEDICATIONS ADVISED ON DISCHARGE: Pravachol 20 mg oral tablet once a day, metformin 500 mg oral tablet 2 times a day, Klor-Con 10 mg oral tablet extended-release once a day, Lasix 80 mg oral tablet 0.5 tablet orally 2 times  a day, lisinopril 0.5 mg oral tablet once a day, carvedilol 3.125 mg oral tablet 2 times a day, aspirin 81 mg chewable tablet once a day, prednisone 10 mg oral tablet once a day, ProAir HFA inhale 1 puff 4 times a day as needed.   DIET: Low sodium. Consistency regular.   TIMEFRAME TO FOLLOWUP: Within 1 to 2 weeks with primary care physician.   TOTAL TIME SPENT: 45 minutes on the discharge:    ____________________________ Hope PigeonVaibhavkumar G. Elisabeth PigeonVachhani, MD [id number] D: 06/01/2012 22:58:31 ET T: 06/02/2012 23:50:20 ET JOB#: [id number]  cc: Hope PigeonVaibhavkumar G. Elisabeth PigeonVachhani, MD, <Dictator> Altamese DillingVAIBHAVKUMAR Shavon Zenz MD ELECTRONICALLY SIGNED 06/24/2012 23:25

## 2014-10-02 NOTE — H&P (Signed)
PATIENT NAME:  [name], [name] MR#:  [id number] DATE OF BIRTH:  [birth year]   PRIMARY CARE PHYSICIAN: None.   CHIEF COMPLAINT:  Shortness of breath, severe orthopnea.   HISTORY OF PRESENT ILLNESS:  This is a 69 year old male with past medical history of coronary artery disease, 60% blockage in the left LAD, severe congestive heart failure, ejection fraction 25% as per the previous records; has no primary care physician because of financial issues. 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 He does not have insurance or does not have money also. Last week he was here in the Emergency Room with a similar complaint and the ER physician gave him the medication prescription under the affordable plan which he purchased from [facility]. He claims that he was taking it regularly as prescribed since the last 5 to 6 days, but there is no improvement in his symptoms. He has to keep sitting in the bed all the time.  If he lies down, he said that within 5 to 10 minutes he will get drowned in the fluids. He denies any chest pain, but feeling severe shortness of breath because of these symptoms. Denies any wheezing and has minimal swelling on the legs. He also has some tingling and numbness in his right leg and pins and needles sensation associated with it. Other than that, he denies all other complaints.   REVIEW OF SYSTEMS:   CONSTITUTIONAL SYMPTOMS: Fever, fatigue, weakness, all denied by patient.   EYES: Denies blurring, double vision, pain or redness.   ENT: Denies any tinnitus, ear pain, hearing loss.   RESPIRATORY: Has severe shortness of breath if he lies down flat on the bed; has to be sitting up, but denies any wheezing or hemoptysis or cough.  CARDIOVASCULAR: Denies any chest pain. Has severe orthopnea and mild edema on the legs, but denies any arrhythmia or palpitations or syncopal episodes.    GASTROINTESTINAL: Denies any nausea, vomiting, diarrhea, abdominal pain or hematemesis.   GENITOURINARY: Denies dysuria, hematuria, increased frequency or incontinence.  ENDOCRINE: Denies polyuria, nocturia, or heat and cold intolerance.   SKIN: Denies any rashes or acne or lesions to the skin.   MUSCULOSKELETAL: Denies any pain in any joints.    NEUROLOGICAL: Denies any weakness but as described above, he has pins and needles sensation in the right foot. He denies any headache or tremors.   PSYCHIATRIC: Denies any anxiety, insomnia or psychiatric problems.   PAST MEDICAL HISTORY:   Positive for diabetes, hypertension, coronary artery disease, hyperlipidemia and cerebrovascular accident without any residual weakness.   PAST SURGICAL HISTORY: Left knee surgery, left hip surgery, right hip and knee cartilage surgery.  SOCIAL HISTORY:  He lives at home. No history of any smoking, and he drinks 2 to 3 beers a week. Denies any drug use, and he is a Cytogeneticistveteran.  FAMILY HISTORY:  Significant for angina in his father and borderline diabetes. Mother is relatively healthy.  PHYSICAL EXAMINATION: VITAL SIGNS: Temperature 98.9, pulse rate 86, respirations 22, blood pressure 136/81 and saturation 95% on room air.  GENERAL: He is fully alert, oriented and cooperative to history taking and physical examination. He is in mild distress and upset about the problems he is facing since almost 8 to 10 days and also because he is not able to get access in the [facility] system.  HEENT: Head and neck  atraumatic. Conjunctivae pink. Oral mucosa moist.  NECK: Supple. No JVD.  CARDIOVASCULAR: S1, S2 present, regular.  RESPIRATORY: Bilateral equal air entry.  A few crepitations heard at the bases. No rhonchi.  ABDOMEN: Soft, nontender. Bowel sounds present. No organomegaly appreciated.  LIMBS: Minimal edema around ankles.  SKIN: No rashes.  NEUROLOGICAL: Grossly normal power 5 out of 5 in all four limbs. No tremors or  rigidity.  PSYCHIATRIC: Does not appear in any acute psychiatric problem at this point of time.   LABORATORY AND DIAGNOSTIC DATA:   BNP 4147.  Glucose 152, BUN 19, creatinine 1.04, sodium 134, potassium 3.9, chloride 101 and CO2 of 26, calcium 9.3, total protein 8, albumin 3.7, bilirubin 0.8, alkaline phosphatase 96, SGOT 24, SGPT 21. Troponin less than 0.02. WBC 11.2, hemoglobin 14.5, hematocrit 43.5, platelet count is 774, MCV 90. Chest x-ray: Cardiomegaly, but not signs of pulmonary edema on chest x-ray. Compared to last week also, it was the same. EKG:  Normal sinus rhythm, possible left atrial enlargement.   ASSESSMENT AND PLAN:  1.  Acute systolic heart failure or it may be some pericardial effusion which is chronic and contributing to the symptoms, as there are no lung findings on chest x-ray, but he has an elevated BNP and he has severe orthopnea which are severely symptomatic signs of congestive heart failure. We will give him intravenous Lasix, Coreg, lisinopril, spironolactone and follow serial troponin. We will do echocardiogram in the morning to see the ejection fraction and to see for any pericardial effusion. Cardiology consult.  2.  Hypertension. We will give him Coreg and lisinopril and spironolactone. 3.  Diabetes mellitus. We will put him on insulin with sliding scale at this time.  4.  Hyperlipidemia. We will give him Pravachol.  5.  Deep venous thrombosis and gastrointestinal prophylaxis.  6.  Care management consult for financial issues as they might help him to get into the [place] system or to help him to get a primary care physician.   TOTAL TIME SPENT:  55 minutes.   CODE STATUS: Full code    ____________________________ Hope Pigeon Elisabeth Pigeon, MD vgv:ct D: 05/27/2012 21:59:00 ET T: 05/28/2012 07:29:05 ET JOB#: [id number]  cc: Hope Pigeon. Elisabeth Pigeon, MD, <Dictator> Altamese Dilling MD ELECTRONICALLY SIGNED 06/24/2012 23:21

## 2014-10-04 NOTE — Consult Note (Signed)
General Aspect 69 year old Caucasian male with past medical history significant for coronary artery disease status post cardiac catheterization in June 2011 showing 90% LAD stenosis status post stent placement, congestive heart failure with systolic dysfunction with an ejection fraction of 35 to 40% two years ago, history of hypertension and diabetes who has been noncompliant with follow up ("no PMD"),  presents to the hospital complaining of 2 to 3 month history of dyspnea, orthopnea, and also on and off chest pain, but progressively getting worse over the last 3 to 4 days. Cardiology was consulted for acute on chronic systolic CHF and chest pain.  short of breath on exertion for the past 2 to 3 months with worsening pedal edema adn ABD swelling.  chest pain in teh past month with exertion. He has had worsening orthopnea and PND. He reports having to  wake up in the middle of night and sit by the side of the bed to take deep breaths. He feels like he is  "drowning in water".  last discharge in June 2011 with no doctor follow up. About a month ago, he saw Dr. Brunetta Genera for possible bronchitis and was put on some antibiotic and also other medications.  He does report significant coffee and water intake.    PAST MEDICAL HISTORY:  1. Diabetes mellitus.  2. Hypertension.  3. Coronary artery disease status post LAD stent in June 2011, to LAD 4. Hyperlipidemia.  5. History of cerebrovascular accident with no residual weakness.    Present Illness . PAST SURGICAL HISTORY:  1. Left knee surgery. 2. Left hip surgery.  3. Right knee cartilage surgery.   DRUG ALLERGIES: No known drug allergies.   CURRENT HOME MEDICATIONS: no recent cardiac meds per the patient. Was given some medsd last month for bronchitis.  SOCIAL HISTORY: He lives at home with mother. No history of any smoking. He used to drink a lot of alcohol, but has cut down to a 6-pack of beer per week. Lots of coffee and water  intake  FAMILY HISTORY: Significant for angina in Dad and borderline diabetes. Mother is relatively healthy.   Physical Exam:   GEN well developed, well nourished, no acute distress, obese    HEENT red conjunctivae    NECK supple     RESP normal resp effort  clear BS     CARD Regular rate and rhythm  No murmur     ABD denies tenderness  soft     LYMPH negative neck    EXTR negative edema    SKIN normal to palpation    NEURO cranial nerves intact, motor/sensory function intact    PSYCH alert, A+O to time, place, person, good insight   Review of Systems:   Subjective/Chief Complaint SOB, chest tightness    General: Fatigue     Skin: No Complaints     ENT: No Complaints     Eyes: No Complaints     Neck: No Complaints     Respiratory: Frequent cough  Short of breath     Cardiovascular: Chest pain or discomfort  with exertion     Gastrointestinal: No Complaints     Genitourinary: No Complaints     Vascular: No Complaints     Musculoskeletal: No Complaints     Neurologic: No Complaints     Hematologic: No Complaints     Endocrine: No Complaints     Psychiatric: No Complaints     Review of Systems: All other systems were reviewed and  found to be negative     Medications/Allergies Reviewed Medications/Allergies reviewed           Admit Diagnosis:   ACUTE CHF: 17-Sep-2011, Active, ACUTE CHF      Admit Reason:   Acute CHF: (428.0) Active, ICD9, Congestive heart failure, unspecified    Cardiac:  06-Apr-13 09:49    Troponin I 0.02   CK, Total 130   CPK-MB, Serum 3.9  Routine Hem:  06-Apr-13 09:49    WBC (CBC) 7.7   RBC (CBC) 4.64   Hemoglobin (CBC) 13.3   Hematocrit (CBC) 41.3   Platelet Count (CBC) 154   MCV 89   MCH 28.8   MCHC 32.3   RDW 14.0  Routine Chem:  06-Apr-13 09:49    Glucose, Serum 188   BUN 14   Creatinine (comp) 1.09   Sodium, Serum 140   Potassium, Serum 3.7   Chloride, Serum 104   CO2, Serum 24   Calcium (Total),  Serum 9.2  Hepatic:  06-Apr-13 09:49    Bilirubin, Total 1.0   Alkaline Phosphatase 107   SGPT (ALT) 44   SGOT (AST) 37   Total Protein, Serum 8.0   Albumin, Serum 4.3  Routine Chem:  06-Apr-13 09:49    Osmolality (calc) 285   eGFR (African American) >60   eGFR (Non-African American) >60   Anion Gap 12   B-Type Natriuretic Peptide [facility]) 4078  Routine Coag:  06-Apr-13 09:49    D-Dimer, Quantitative 1.83  Cardiac:  06-Apr-13 18:13    Troponin I 0.03   CK, Total 136   CPK-MB, Serum 3.5   EKG:   Interpretation Sinus tachycardia with rate 114 bpm, Poor R wave progression, unable to exclude old anterior MI   Radiology Results:  XRay:    06-Apr-13 10:04, Chest PA and Lateral   Chest PA and Lateral    REASON FOR EXAM:    Chest Pain  COMMENTS:       PROCEDURE: DXR - DXR CHEST PA (OR AP) AND LATERAL  - Sep 16 2011 10:04AM     RESULT: Comparison: 11/11/2009    Findings:  Cardiomegaly and the mediastinum are similar to prior. No focal pulmonary   opacities.    IMPRESSION:   No acute cardio pulmonary disease.        Verified By: Gregor Hams, M.D., MD  Cardiology:    07-Apr-13 06:20, Echo Doppler   Echo Doppler    Interpretation Summary    Sinus tachycardia noted. The left ventricle is mildly dilated. Left   ventricular systolic function is moderate to severely reduced.   Ejection Fraction = <25%. The transmitral spectral Doppler flow   pattern is suggestiveof impaired LV relaxation. There is moderate   to severe global hypokinesis of the left ventricle. The right   ventricle is mildly dilated. The right ventricular systolic function   is mildly reduced. The left atrium is moderately dilated. The right   atrium is mildly dilated. There is mild to moderate mitral   regurgitation. There is mild to moderate tricuspid regurgitation.   Right ventricular systolic pressure is elevated at 40-40mHg.   Moderate scerlosis and leaflet thickening, annular calcification.     PatientHeight: 193 cm    PatientWeight: 1983kg    SystolicPressure: 1382mmHg    DiastolicPressure: 87 mmHg    HeartRate: 109 bpm    BSA: 2.6 m2    Procedure:    A two-dimensional transthoracic echocardiogram with color flow and  Doppler was performed.    Left Ventricle    Ejection Fraction = <25%.    The transmitral spectral Doppler flow pattern is suggestive of   impaired LV relaxation.    There is moderate to severe global hypokinesis of the left ventricle.    The left ventricle ismildly dilated.    There is normal left ventricular wall thickness.    Left ventricular systolic function is moderate to severely reduced.    Right Ventricle    The right ventricle is mildly dilated.    The right ventricular systolic function is mildly reduced.    Atria    The left atrium is moderately dilated.    The right atrium is mildly dilated.    There is no Doppler evidence for an atrial septal defect.    Mitral Valve    Calcified mitral apparatus.    There is no mitral valve stenosis.    There is mild to moderate mitral regurgitation.    Tricuspid Valve    There is mild to moderate tricuspid regurgitation.    Right ventricular systolic pressure is elevated at 40-48mHg.    Aortic Valve    Moderate scerlosis and leaflet thickening, annular calcification.    No hemodynamically significant valvular aortic stenosis.    No aortic regurgitation is present.    Pulmonic Valve    The pulmonic valve is not well visualized.    Trace pulmonic valvular regurgitation.    Great Vessels    The aortic root is normal size.    The pulmonary is not well visualized.    Pericardium/Pleural    There is no pleural effusion.    No pericardial effusion.    MMode 2D Measurements and Calculations    RVDd: 4.7 cm    IVSd: 0.97 cm    LVIDd: 6.1 cm    LVIDs: 4.6 cm    LVPWd: 1.1 cm    FS: 25 %    EF(Teich): 49 %    EPSS: 2.2 cm    Ao root diam: 3.4 cm    ACS: 1.5 cm    LA dimension: 5.1 cm     LVOT diam: 2.5 cm    Doppler Measurements and Calculations    MV E point: 86 cm/sec    MV A point: 127 cm/sec  MV E/A: 0.68     MV P1/2t max vel: 117 cm/sec    MV P1/2t: 74 msec    MVA(P1/2t): 3.0 cm2    MV dec slope: 464 cm/sec2    Ao V2 max: 166 cm/sec    Ao max PG: 11 mmHg    Ao V2 mean: 122 cm/sec    Ao mean PG: 7.0 mmHg    Ao V2 VTI: 25 cm    AVA(V,D): 1.7 cm2    LV max PG: 1.0 mmHg    LV V1 max: 57 cm/sec    MR max vel: 497 cm/sec    MR max PG: 99 mmHg    PA V2 max: 62 cm/sec    PA max PG: 2.0 mmHg    TR Max vel: 281 cm/sec    TR Max PG: 32 mmHg    RVSP: 42 mmHg    RAP systole: 10 mmHg    Reading Physician: GIda Rogue  Sonographer: TBerline Lopes, RDCS  Interpreting Physician:  TIda Rogue  electronically signed on   09-17-2011 11:59:56  Requesting Physician: GIda Rogue CT:    06-Apr-13 16:04, CT Chest for Pulm Embolism With Contrast  CT Chest for Pulm Embolism With Contrast    REASON FOR EXAM:    dyspnea  COMMENTS:       PROCEDURE: CT  - CT CHEST (FOR PE) W  - Sep 16 2011  4:04PM     RESULT: Comparison: 11/11/2009    Technique: Multiple thin section axial images were obtained from the lung   apices to the upper abdomen following 100 ml Isovue 370 intravenous   contrast, according to the PE protocol. These images were also reviewed   on a Siemens multiplanar work station.    Findings:   No mediastinal, maxillary, or hilar lymphadenopathy. There are several   small lymph nodes in the mediastinum, which are not pathologically     enlarged. Calcifications are seen in the coronary arteries. There is a   trace amount of perihepatic ascites. There is a trace right pleural   effusion. There is a small exophytic low-attenuation lesion in the   interpolar region of the right kidney which is indeterminate by density   on today's study. Previously it measured density of water but has   slightly increased in size. It measures 16 mm in diameter.  The cardiac   silhouette is enlarged.    The thoracic aorta is normal in caliber. No pulmonary embolus identified.    Mild groundglass opacities in the lower lobes may be related atelectasis   or pulmonary edema.    No aggressive lytic or sclerotic osseous lesions are identified.    IMPRESSION:   1. No pulmonary embolus identified. There are coronary artery   calcifications.  2. Mild groundglass opacities at the lung bases may be related to   atelectasis or pulmonary edema.  3. Trace right pleural effusion and trace amount of perihepatic ascites,   which is nonspecific.  4. Small low attenuation lesion in the right kidney likely represents a   small hemorrhagic or proteinaceous cyst, but is indeterminate. Followup   multiphasic contrast-enhanced renal CT is recommended for further   characterization.          Verified By: Gregor Hams, M.D., MD    NKA: None  Vital Signs/Nurse's Notes:  **Vital Signs.:   07-Apr-13 11:40   Vital Signs Type Routine   Temperature Temperature (F) 97.3   Celsius 36.2   Temperature Source oral   Pulse Pulse 96   Pulse source per Dinamap   Respirations Respirations 18   Systolic BP Systolic BP 622   Diastolic BP (mmHg) Diastolic BP (mmHg) 83   Mean BP 98   BP Source Dinamap   Pulse Ox % Pulse Ox % 96   Pulse Ox Activity Level  At rest   Oxygen Delivery Room Air/ 21 %     Impression 69 year old Caucasian male with past medical history significant for coronary artery disease status post cardiac catheterization in June 2011 showing 90% LAD stenosis status post stent placement, congestive heart failure with systolic dysfunction with an ejection fraction of 35 to 40% two years ago, history of hypertension and diabetes who has been noncompliant with follow up ("no PMD"),  presents to the hospital complaining of 2 to 3 month history of dyspnea, orthopnea, and also on and off chest pain, but progressively getting worse over the last 3 to 4 days.    A/P: 1) SOB acute on chronic systolic CHF EF now <63%, decrease from two years ago Improving slowly on lasix IV.  Negative 4 liters so far. --Started on coreg and lisinopril --Would continue  lasix BID Unable to exclude ischemia with drop in EF Cardiac cath planned for tomorrow. no active chest pain today.  2) Chest pain Previous CAD, PCI to LAD Cardiac cath planned for tomorrow. Contineu ASA.  3) Diabetes: Will defer management to medical servise. Will need outpt PMD to manage Could start metformin after cath (hold prior to cath)  4) Hyperlipidemia On a statin now, will need to continue as an outpt Goal LDL <70  5) HTN: Can titrate coreg and lisinopril upwards   Electronic Signatures: Ida Rogue (MD)  (Signed 07-Apr-13 13:21)  Authored: General Aspect/Present Illness, History and Physical Exam, Review of System, Past Medical History, Health Issues, Home Medications, Labs, EKG , Radiology, Allergies, Vital Signs/Nurse's Notes, Impression/Plan   Last Updated: 07-Apr-13 13:21 by Ida Rogue (MD)

## 2014-10-04 NOTE — Discharge Summary (Signed)
PATIENT NAME:  [name], PARDINI MR#:  [id number] DATE OF BIRTH:  [birth year]  DATE OF ADMISSION:  09/16/2011 DATE OF DISCHARGE:  09/19/2011  DISCHARGE DIAGNOSES:  1. Acute on chronic systolic heart failure, now improving.  2. Coronary artery disease, status post LAD stenting in 2011, status post cardiac cath showing moderate LAD disease not requiring stenting.   SECONDARY DIAGNOSES:  1. Hypertension.  2. Diabetes.  3. Hyperlipidemia.  4. History of cerebrovascular accident.   CONSULTATION: Cardiology, Dr. Kirke Corin    PROCEDURES/RADIOLOGY:  1. Cardiac cath on April 8th by Dr. Kirke Corin showed minimal coronary disease with pressure wire interrogation of LAD. No PCI recommended.  2. 2-D echo on April 7th showed mildly dilated left ventricle. Moderate to severely reduced LV systolic function. Moderate to severe global hypokinesis of the left ventricle. Mildly reduced RV systolic function. Moderately dilated left atrium. Mildly dilated right atrium. Mild to moderate mitral and tricuspid regurgitation. Elevated RV systolic pressure at 40 to 50 mmHg.  3. CT scan of the chest with contrast on April 6th showed no pulmonary embolism. Coronary artery calcification. Possible atelectasis or pulmonary edema at the lung bases. Trace right pleural effusion and trace amount of perihepatic ascites. Proteinaceous cyst in the kidney on the right.   HISTORY AND SHORT HOSPITAL COURSE: The patient is a 69 year old male with above-mentioned medical problems who was admitted for acute on chronic congestive heart failure with worsening dyspnea on exertion, chest pain, and orthopnea. He was started on IV diuretics. Cardiology consult was obtained with Dr. Mariah Milling and Dr. Kirke Corin from [facility] who recommended cardiac catheterization along with 2-D echocardiogram which was performed with results dictated above. The patient's ejection fraction dropped to less than 25% which was decreased from previous evaluation about two  years ago. The patient was feeling much better with significant diuresis with Lasix. He was started on Coreg and lisinopril. His cardiac cath on April 8th by Dr. Kirke Corin did not show any significant coronary disease. It did show moderate LAD disease and recommended medical management. The patient was feeling much better on April 9th and was discharged home in stable condition.   On the date of discharge, his vital signs were as follows: Temperature 99, heart rate 94 per minute, respirations 20 per minute, blood pressure 131/92 mmHg. He was saturating 90% on room air.   PERTINENT PHYSICAL EXAMINATION: CARDIOVASCULAR: S1, S2 normal. No murmur, rubs, or gallop. LUNGS: Clear to auscultation bilaterally. No wheezing, rales, rhonchi, or crepitation. ABDOMEN: Soft, benign. NEUROLOGIC: Nonfocal examination. All other physical examination remained at the baseline.   DISCHARGE MEDICATIONS:  1. Crestor 20 mg p.o. daily.  2. Flomax 0.4 mg p.o. daily. 3. Janumet XR 50/1000 mg 1 tablet p.o. daily.  4. Finasteride 5 mg p.o. daily.  5. Linzess 145 mcg p.o. daily. 6. Lasix 40 mg p.o. b.i.d.  7. Aspirin 81 mg p.o. daily.  8. Coreg 12.5 mg p.o. b.i.d.  9. K-Dur 20 mEq p.o. b.i.d.  10. Lisinopril 10 mg p.o. daily.   DISCHARGE DIET: Low sodium, 1800 ADA, low cholesterol.   DISCHARGE ACTIVITY: As tolerated.   DISCHARGE INSTRUCTIONS AND FOLLOW-UP:  1. The patient was instructed to follow-up with his primary care physician, Dr. Vonita Moss, in 1 to 2 weeks.  2. He will need follow-up with Dr. Mariah Milling in two weeks.      TOTAL TIME DISCHARGING THIS PATIENT: 55 minutes.   ____________________________ Ellamae Sia. Sherryll Burger, MD vss:drc D: 09/19/2011 23:10:11 ET T: 09/20/2011 10:48:41 ET JOB#: [id number]  cc: Michiko Lineman S. Sherryll BurgerShah, MD, <Dictator> Steele SizerMark A. Crissman, MD Antonieta Ibaimothy J. Gollan, MD Ellamae SiaVIPUL S [facility] MD ELECTRONICALLY SIGNED 09/20/2011 17:12

## 2014-10-04 NOTE — H&P (Signed)
PATIENT NAME:  [name], [name] MR#:  [id number] DATE OF BIRTH:  [birth year]  DATE OF ADMISSION:  09/16/2011  ADMITTING PHYSICIAN: Enid Baasadhika Aristidis Talerico, MD  PRIMARY CARE PHYSICIAN: None.  REASON FOR ADMISSION: Dyspnea.   HISTORY OF PRESENT ILLNESS: [name] is a 69 year old Caucasian male with past medical history significant for coronary artery disease status post cardiac catheterization in June 2011 showing 90% LAD stenosis status post stent placement, congestive heart failure with systolic dysfunction with an ejection fraction of 35 to 40%, history of hypertension and diabetes who has been noncompliant with his medications who presents to the hospital complaining of 2 to 3 month history of dyspnea, orthopnea, and also on and off chest pain, but progressively getting worse over the last 3 to 4 days. He says he has been short of breath on exertion for the past 2 to 3 months with worsening pedal edema and associated with on and off chest pain. Also has orthopnea and also PND. He has to wake up in the middle of night and sit by the side of the bed to take deep breaths. He feels like he is drowning in water. He has not been taking any of his medications and has not been seeing a primary care physician since his last discharge in June 2011. About a month ago, he saw Dr. Lacie Bender for possible bronchitis and was put on some antibiotic and also other medications, which he does not know of currently. He was found to be having mild congestive heart failure exacerbation with elevated BNP and also dyspnea on exertion. So he is being admitted to the telemetry floor for congestive heart failure management.   PAST MEDICAL HISTORY:  1. Diabetes mellitus.  2. Hypertension.  3. Coronary artery disease status post LAD stent in June 2011.  4. Hyperlipidemia.  5. History of cerebrovascular accident with no residual weakness.   PAST SURGICAL HISTORY:  1. Left knee surgery. 2. Left hip surgery.  3. Right knee  cartilage surgery.   DRUG ALLERGIES: No known drug allergies.   CURRENT HOME MEDICATIONS: The patient does not know his home medications, so I asked the emergency room RN to [facility] [place] to get his medication list. We will followup.   SOCIAL HISTORY: He lives at home with mother. No history of any smoking. He used to drink a lot of alcohol, but has cut down to a 6-pack of beer per week.   FAMILY HISTORY: Significant for angina in Dad and borderline diabetes. Mother is relatively healthy.   REVIEW OF SYSTEMS: CONSTITUTIONAL: No fever, fatigue, or weakness. EYES: No blurred vision or double vision. Uses reading glasses. No glaucoma or cataracts. ENT: No tinnitus, ear pain, epistaxis, or hearing loss. RESPIRATORY: No cough, wheeze, or chronic obstructive pulmonary disease. Positive for dyspnea. CARDIOVASCULAR: Positive for chest pain and orthopnea, also PND. Positive for pedal edema. No arrhythmias. GI: No nausea, vomiting, diarrhea, abdominal pain, hematemesis, or melena. GENITOURINARY: No dysuria, hematuria, renal calculus, frequency, or incontinence. ENDOCRINE: No polyuria, nocturia, thyroid problems, or heat or cold intolerance. HEMATOLOGY: No anemia, easy bruising or bleeding. SKIN: No acne, rash, or lesions. MUSCULOSKELETAL: No neck, back, neck pain, or back pain. Positive for bilateral knee pain and also hip pain. No gout. NEUROLOGIC: Remote history of stroke with no residual neurological weakness. No seizures. PSYCH: No anxiety, insomnia, or depression.   PHYSICAL EXAMINATION:   VITAL SIGNS: Temperature 97.8 degrees Fahrenheit, pulse 109, blood pressure 137/87, respirations 18, and pulse oximetry 95% on  room air.   GENERAL: Well built, well nourished male lying in bed, not in any acute distress.   HEENT: Normocephalic, atraumatic. Pupils equal, round, and reacting to light. Anicteric sclerae. Extraocular movements intact. Oropharynx clear without erythema, mass, or  exudates.   NECK: Supple. No thyromegaly, JVD, or carotid bruits. No lymphadenopathy.   LUNGS: Moving air bilaterally. Fine bibasilar crackles with scanty breath sounds. No wheeze. No use of accessory muscles for breathing.   CARDIOVASCULAR: S1 and S2 regular rate and rhythm. No murmurs, rubs, or gallops.   ABDOMEN: Obese, soft, nontender, and nondistended. No hepatosplenomegaly. Normal bowel sounds.   EXTREMITIES: No pedal edema currently on my examination. Feeble dorsalis pedis pulses. No clubbing or cyanosis.   SKIN: No acne, rash, or lesions.   LYMPHATICS: No cervical or inguinal lymphadenopathy.   NEUROLOGIC: Cranial nerves intact. No focal motor or sensory deficits.   PSYCH: The patient is awake, alert, and oriented x3.   LABS/STUDIES: WBC 7.7, hemoglobin 13.3, hematocrit 41.3, and platelet count 154.   Sodium 140, potassium 3.7, chloride 104, bicarbonate 24, BUN 14, creatinine 1.09, glucose 188, and calcium 9.2.   ALT 44, AST 37, alkaline phosphatase 107, total bilirubin 1.0, and albumin 4.3. BNP was elevated at 4078. D-dimer was elevated at 1.83.   First set of cardiac enzymes negative.   EKG is showing sinus tachycardia.  Chest x-ray is showing no acute cardiopulmonary disease.   ASSESSMENT AND PLAN: This is a 69 year old male with history of hypertension, diabetes, coronary artery disease, and congestive heart failure with an ejection fraction of 35 to 45% admitted for symptoms of dyspnea on exertion, chest pain, and orthopnea.  1. Acute respiratory failure secondary to acute on chronic systolic congestive heart failure exacerbation. Last known ejection fraction is 35 to 45%. However, chest x-ray does not show overt pulmonary edema. He has been noncompliant with his medications. We will admit the patient to telemetry, check cardiac enzymes x3 sets, and obtain cardiology consultation by [facility]. We will start IV Lasix 40 mg twice a day for now and also follow up  echocardiogram. Continue aspirin and started on Coreg, lisinopril, and statin. Also, with his symptoms of dyspnea on exertion with elevated d-dimer, we will get a CT of the chest to rule out PE.  2. Chest pain, likely from above.  3. Coronary artery disease, status post stent placement. Medical management and monitor on telemetry. Follow up echocardiogram.  4. Hypertension. Continue Coreg and lisinopril.  5. Diabetes mellitus. Check Hemoglobin A1c prior to starting on medications. Continue sliding scale insulin for now and also check lipid profile.  6. GI and deep vein thrombosis prophylaxis. The patient is on Protonix and also Lovenox.   CODE STATUS: FULL CODE.   TIME SPENT ON ADMISSION: 50 minutes. ____________________________ Enid Baas, MD rk:slb D: 09/16/2011 13:28:00 ET T: 09/16/2011 14:08:01 ET JOB#: [id number]  cc: Enid Baas, MD, <Dictator> Enid Baas MD ELECTRONICALLY SIGNED 09/20/2011 13:43

## 2014-12-24 ENCOUNTER — Ambulatory Visit: Payer: Self-pay | Admitting: Cardiovascular Disease

## 2014-12-26 DIAGNOSIS — B356 Tinea cruris: Secondary | ICD-10-CM | POA: Diagnosis not present

## 2014-12-26 DIAGNOSIS — H8113 Benign paroxysmal vertigo, bilateral: Secondary | ICD-10-CM | POA: Diagnosis not present

## 2014-12-26 DIAGNOSIS — E119 Type 2 diabetes mellitus without complications: Secondary | ICD-10-CM | POA: Diagnosis not present

## 2014-12-30 DIAGNOSIS — B356 Tinea cruris: Secondary | ICD-10-CM | POA: Diagnosis not present

## 2014-12-30 DIAGNOSIS — E119 Type 2 diabetes mellitus without complications: Secondary | ICD-10-CM | POA: Diagnosis not present

## 2014-12-30 DIAGNOSIS — H8113 Benign paroxysmal vertigo, bilateral: Secondary | ICD-10-CM | POA: Diagnosis not present

## 2015-01-11 DIAGNOSIS — H6123 Impacted cerumen, bilateral: Secondary | ICD-10-CM | POA: Diagnosis not present

## 2015-01-11 DIAGNOSIS — B356 Tinea cruris: Secondary | ICD-10-CM | POA: Diagnosis not present

## 2015-01-14 ENCOUNTER — Encounter: Payer: Self-pay | Admitting: Cardiovascular Disease

## 2015-01-14 ENCOUNTER — Ambulatory Visit (INDEPENDENT_AMBULATORY_CARE_PROVIDER_SITE_OTHER): Payer: Medicare Other | Admitting: Cardiovascular Disease

## 2015-01-14 VITALS — BP 122/84 | HR 76 | Ht 77.0 in | Wt 316.8 lb

## 2015-01-14 DIAGNOSIS — R42 Dizziness and giddiness: Secondary | ICD-10-CM | POA: Diagnosis not present

## 2015-01-14 DIAGNOSIS — E785 Hyperlipidemia, unspecified: Secondary | ICD-10-CM

## 2015-01-14 DIAGNOSIS — R0602 Shortness of breath: Secondary | ICD-10-CM

## 2015-01-14 DIAGNOSIS — I2510 Atherosclerotic heart disease of native coronary artery without angina pectoris: Secondary | ICD-10-CM | POA: Diagnosis not present

## 2015-01-14 DIAGNOSIS — I5022 Chronic systolic (congestive) heart failure: Secondary | ICD-10-CM

## 2015-01-14 DIAGNOSIS — F1010 Alcohol abuse, uncomplicated: Secondary | ICD-10-CM

## 2015-01-14 MED ORDER — SACUBITRIL-VALSARTAN 49-51 MG PO TABS: 1.0000 | ORAL_TABLET | Freq: Two times a day (BID) | ORAL | Status: DC

## 2015-01-14 MED ORDER — METFORMIN HCL 500 MG PO TABS: 500.0000 mg | ORAL_TABLET | Freq: Two times a day (BID) | ORAL | Status: DC

## 2015-01-14 NOTE — Assessment & Plan Note (Signed)
Currently with no symptoms of angina. No further workup at this time. Continue current medication regimen. 
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
## 2015-01-14 NOTE — Assessment & Plan Note (Signed)
Recommended alcohol cessation. 

## 2015-01-14 NOTE — Assessment & Plan Note (Signed)
Recommend compliance with his pravastatin 40 mg daily. Goal LDL less than 70

## 2015-01-14 NOTE — Assessment & Plan Note (Signed)
Shortness of breath with exertion likely secondary to systolic CHF/dysfunction, obesity, deconditioning. Medication changes as above

## 2015-01-14 NOTE — Assessment & Plan Note (Signed)
Prior ejection fraction less than 35% Recommended he hold his lisinopril, We will start entresto 49/51 milligram by mouth twice a day In one month if blood pressure will tolerate, will increase to the higher dose

## 2015-01-14 NOTE — Patient Instructions (Signed)
You are doing well.  Please hold the lisinopril Start entresto 1 pill twice a day for heart failure prevention  Please call us if you have new issues that need to be addressed before your next appt.  Your physician wants you to follow-up in: 1 month.

## 2015-01-18 ENCOUNTER — Encounter: Payer: Self-pay | Admitting: *Deleted

## 2015-01-19 ENCOUNTER — Telehealth: Payer: Self-pay | Admitting: *Deleted

## 2015-01-19 NOTE — Telephone Encounter (Signed)
Pt has been approved from 01/18/15-01/18/16.

## 2015-01-27 ENCOUNTER — Telehealth: Payer: Self-pay | Admitting: *Deleted

## 2015-01-27 NOTE — Telephone Encounter (Signed)
Spoke with Jonny Ruiz regarding the [employer] prior authorization and stated that Sherryll Burger is approved from 01/18/2015 to 01/18/2016.

## 2015-01-27 NOTE — Telephone Encounter (Signed)
Entresto calling needing a Prior Auth please call

## 2015-02-02 ENCOUNTER — Other Ambulatory Visit: Payer: Self-pay | Admitting: Otolaryngology

## 2015-02-02 DIAGNOSIS — R42 Dizziness and giddiness: Secondary | ICD-10-CM | POA: Diagnosis not present

## 2015-02-02 DIAGNOSIS — G510 Bell's palsy: Secondary | ICD-10-CM | POA: Diagnosis not present

## 2015-02-09 ENCOUNTER — Ambulatory Visit
Admission: RE | Admit: 2015-02-09 | Discharge: 2015-02-09 | Disposition: A | Discharge status: Home / Self Care | Payer: Medicare Other | Source: Ambulatory Visit | Attending: Otolaryngology | Admitting: Otolaryngology

## 2015-02-09 DIAGNOSIS — R42 Dizziness and giddiness: Secondary | ICD-10-CM | POA: Insufficient documentation

## 2015-02-09 DIAGNOSIS — I639 Cerebral infarction, unspecified: Secondary | ICD-10-CM | POA: Diagnosis not present

## 2015-02-09 MED ORDER — GADOBENATE DIMEGLUMINE 529 MG/ML IV SOLN
20.0000 mL | Freq: Once | INTRAVENOUS | Status: AC | PRN
  Administered 2015-02-09: 20 mL via INTRAVENOUS

## 2015-02-11 DIAGNOSIS — R269 Unspecified abnormalities of gait and mobility: Secondary | ICD-10-CM | POA: Diagnosis not present

## 2015-02-11 DIAGNOSIS — R279 Unspecified lack of coordination: Secondary | ICD-10-CM | POA: Diagnosis not present

## 2015-02-16 DIAGNOSIS — R42 Dizziness and giddiness: Secondary | ICD-10-CM | POA: Diagnosis not present

## 2015-02-22 ENCOUNTER — Encounter: Payer: Self-pay | Admitting: *Deleted

## 2015-02-22 ENCOUNTER — Ambulatory Visit (INDEPENDENT_AMBULATORY_CARE_PROVIDER_SITE_OTHER): Payer: Medicare Other | Admitting: Diagnostic Neuroimaging

## 2015-02-22 VITALS — BP 137/79 | HR 90 | Ht 77.0 in | Wt 311.0 lb

## 2015-02-22 DIAGNOSIS — G450 Vertebro-basilar artery syndrome: Secondary | ICD-10-CM

## 2015-02-22 MED ORDER — CLOPIDOGREL BISULFATE 75 MG PO TABS: 75.0000 mg | ORAL_TABLET | Freq: Every day | ORAL | Status: DC
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
## 2015-02-22 NOTE — Patient Instructions (Addendum)
I will check CTA head / neck.   I will check echocardiogram.  Start plavix  daily. Continue aspirin  daily.  Check blood sugar every AM.

## 2015-02-23 LAB — HEMOGLOBIN A1C
ESTIMATED AVERAGE GLUCOSE: 217 mg/dL
Hgb A1c MFr Bld: 9.2 % — ABNORMAL HIGH (ref 4.8–5.6)

## 2015-02-23 LAB — LIPID PANEL
CHOL/HDL RATIO: 8.1 ratio — AB (ref 0.0–5.0)
Cholesterol, Total: 251 mg/dL — ABNORMAL HIGH (ref 100–199)
HDL: 31 mg/dL — AB (ref >39)
LDL Calculated: 174 mg/dL — ABNORMAL HIGH (ref 0–99)
Triglycerides: 231 mg/dL — ABNORMAL HIGH (ref 0–149)
VLDL CHOLESTEROL CAL: 46 mg/dL — AB (ref 5–40)

## 2015-02-25 ENCOUNTER — Telehealth: Payer: Self-pay | Admitting: *Deleted

## 2015-02-25 NOTE — Telephone Encounter (Signed)
-----   Message from Suanne Marker, MD sent at 02/24/2015  5:28 PM EDT ----- Needs aggressive control of his risk factors. Follow up with PCP asap. -VRP

## 2015-02-25 NOTE — Telephone Encounter (Signed)
error 

## 2015-02-25 NOTE — Telephone Encounter (Signed)
Spoke with patient and advised him, per Dr Marjory Lies, he needs to FU with PCP asap re: lab results. Patient sees Dr Joelyn Oms, Cheree Ditto and states he has appointment with his cardiologist next week. This RN will fax lab results to Dr Joelyn Oms and cardiologist, with patient's permission. Patient verbalized understanding, agreement ot call PCP and schedule FU asap. Lab results successfully faxed; staff message sent to patient's cardiologist, Dr Mariah Milling.

## 2015-03-01 ENCOUNTER — Encounter: Payer: Self-pay | Admitting: Cardiovascular Disease

## 2015-03-01 ENCOUNTER — Ambulatory Visit (INDEPENDENT_AMBULATORY_CARE_PROVIDER_SITE_OTHER): Payer: Medicare Other | Admitting: Cardiovascular Disease

## 2015-03-01 ENCOUNTER — Telehealth: Payer: Self-pay | Admitting: Diagnostic Neuroimaging

## 2015-03-01 ENCOUNTER — Telehealth: Payer: Self-pay

## 2015-03-01 VITALS — BP 148/86 | HR 70 | Ht 77.0 in | Wt 320.8 lb

## 2015-03-01 DIAGNOSIS — I2510 Atherosclerotic heart disease of native coronary artery without angina pectoris: Secondary | ICD-10-CM

## 2015-03-01 DIAGNOSIS — G450 Vertebro-basilar artery syndrome: Secondary | ICD-10-CM | POA: Diagnosis not present

## 2015-03-01 DIAGNOSIS — R42 Dizziness and giddiness: Secondary | ICD-10-CM | POA: Diagnosis not present

## 2015-03-01 DIAGNOSIS — I10 Essential (primary) hypertension: Secondary | ICD-10-CM

## 2015-03-01 DIAGNOSIS — E785 Hyperlipidemia, unspecified: Secondary | ICD-10-CM

## 2015-03-01 DIAGNOSIS — I5022 Chronic systolic (congestive) heart failure: Secondary | ICD-10-CM

## 2015-03-01 DIAGNOSIS — F1010 Alcohol abuse, uncomplicated: Secondary | ICD-10-CM

## 2015-03-01 MED ORDER — FUROSEMIDE 40 MG PO TABS: 40.0000 mg | ORAL_TABLET | Freq: Two times a day (BID) | ORAL | Status: DC | PRN

## 2015-03-01 NOTE — Telephone Encounter (Signed)
Sharon/Dr Mariah Milling [phone] called to advise patient is in their office now and stated he is supposed to have CT of head and neck. Dr Geraldine Contras put the order in for it but hasn't heard anything from [place]. Jasmine December states they scheduled this at outpatient Imaging center on [address] Ph [phone] on 03/03/15 1:00pm to arrive at 12:45pm. Jasmine December is wanting to find out who will do the prior authorization for this.

## 2015-03-01 NOTE — Telephone Encounter (Signed)
Spoke with Doristine Section with [facility] the Authorization # [id number] Exp. 04/08/2015 for CT Angio of Head & Neck that is scheduled for Sept 21, 2016 at [facility] arrive at 12:45.

## 2015-03-01 NOTE — Assessment & Plan Note (Signed)
He denies any recent alcohol abuse. Likely contributed to his cardiomyopathy Echocardiogram at a later date when he is able to afford this

## 2015-03-01 NOTE — Assessment & Plan Note (Addendum)
Continues to have episodes, 3 times yesterday, short 1 this morning We have called radiology and scheduled his CT head and neck for Wednesday the 21st. If this confirms vertebral artery stenosis, may need referral to [facility]. Reports insurance does not cover [facility]. Previously seen by Dr. Lorretta Harp of vein and vascular in [facility] If no vertebral artery disease seen on CT scan, should consider workup for arrhythmia. We did discuss to day monitor versus 30 day monitor. Will look for scan results first

## 2015-03-01 NOTE — Patient Instructions (Addendum)
You are doing well.  Ask primary care about medication for BPH, Such as flomax, cardura/tasulosin  Please call us if you have new issues that need to be addressed before your next appt.  Your physician wants you to follow-up in: 6 months.  You will receive a reminder letter in the mail two months in advance. If you don't receive a letter, please call our office to schedule the follow-up appointment.

## 2015-03-01 NOTE — Assessment & Plan Note (Signed)
We'll continue pravastatin for now. Goal LDL less than 70

## 2015-03-01 NOTE — Assessment & Plan Note (Signed)
Reports he lost his job recently, money is a  major issue We'll hold off on echocardiogram at this time, at a later date we'll schedule in the office where it is cheaper for him  Ideally would like to increase the  entresto up to full strength  99/101 milligrams by mouth twice a day  We will keep him at the lower strength to maintain good perfusion pressure through the possible vertebral artery stenosis

## 2015-03-01 NOTE — Assessment & Plan Note (Signed)
No changes to his blood pressure medications at this time May need medications for BPH given his nocturia We will increase dose of entresto in follow-up visit

## 2015-03-01 NOTE — Assessment & Plan Note (Signed)
Currently with no symptoms of angina. No further workup at this time. Continue current medication regimen. 

## 2015-03-01 NOTE — Telephone Encounter (Signed)
[facility] outpatient imaging aware of Authorization #[id number]
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
## 2015-03-01 NOTE — Telephone Encounter (Signed)
Spoke with sharon and informed her the ct scans have been preauthorized. Gave her PA #. She stated she would call imaging center with information.

## 2015-03-02 DIAGNOSIS — Z79899 Other long term (current) drug therapy: Secondary | ICD-10-CM | POA: Diagnosis not present

## 2015-03-02 DIAGNOSIS — Z96649 Presence of unspecified artificial hip joint: Secondary | ICD-10-CM | POA: Diagnosis not present

## 2015-03-02 DIAGNOSIS — I10 Essential (primary) hypertension: Secondary | ICD-10-CM | POA: Diagnosis not present

## 2015-03-02 DIAGNOSIS — R42 Dizziness and giddiness: Secondary | ICD-10-CM | POA: Diagnosis not present

## 2015-03-02 DIAGNOSIS — I651 Occlusion and stenosis of basilar artery: Secondary | ICD-10-CM | POA: Diagnosis not present

## 2015-03-02 DIAGNOSIS — G459 Transient cerebral ischemic attack, unspecified: Secondary | ICD-10-CM | POA: Diagnosis not present

## 2015-03-02 DIAGNOSIS — E119 Type 2 diabetes mellitus without complications: Secondary | ICD-10-CM | POA: Diagnosis not present

## 2015-03-02 DIAGNOSIS — Z7982 Long term (current) use of aspirin: Secondary | ICD-10-CM | POA: Diagnosis not present

## 2015-03-02 DIAGNOSIS — I6503 Occlusion and stenosis of bilateral vertebral arteries: Secondary | ICD-10-CM | POA: Diagnosis not present

## 2015-03-02 DIAGNOSIS — I6789 Other cerebrovascular disease: Secondary | ICD-10-CM | POA: Diagnosis not present

## 2015-03-03 ENCOUNTER — Inpatient Hospital Stay: Admission: RE | Admit: 2015-03-03 | Payer: Medicare Other | Source: Ambulatory Visit

## 2015-03-03 ENCOUNTER — Ambulatory Visit: Admission: RE | Admit: 2015-03-03 | Payer: Medicare Other | Source: Ambulatory Visit

## 2015-03-15 ENCOUNTER — Institutional Professional Consult (permissible substitution): Payer: Medicare Other | Admitting: Neurology

## 2015-03-24 ENCOUNTER — Ambulatory Visit: Payer: Medicare Other | Admitting: Diagnostic Neuroimaging

## 2015-08-30 ENCOUNTER — Encounter: Payer: Self-pay | Admitting: *Deleted

## 2015-08-30 ENCOUNTER — Ambulatory Visit: Payer: Medicare Other | Admitting: Cardiovascular Disease

## 2015-12-06 ENCOUNTER — Observation Stay (HOSPITAL_BASED_OUTPATIENT_CLINIC_OR_DEPARTMENT_OTHER)
Admit: 2015-12-06 | Discharge: 2015-12-06 | Disposition: A | Discharge status: Home / Self Care | Payer: Medicare Other | Attending: Internal Medicine | Admitting: Internal Medicine

## 2015-12-06 ENCOUNTER — Observation Stay
Admission: EM | Admit: 2015-12-06 | Discharge: 2015-12-09 | Disposition: A | Discharge status: Home / Self Care | Payer: Medicare Other | Attending: Internal Medicine | Admitting: Internal Medicine

## 2015-12-06 ENCOUNTER — Emergency Department: Payer: Medicare Other

## 2015-12-06 ENCOUNTER — Other Ambulatory Visit: Payer: Self-pay

## 2015-12-06 DIAGNOSIS — I5023 Acute on chronic systolic (congestive) heart failure: Secondary | ICD-10-CM | POA: Diagnosis present

## 2015-12-06 DIAGNOSIS — Z96642 Presence of left artificial hip joint: Secondary | ICD-10-CM | POA: Insufficient documentation

## 2015-12-06 DIAGNOSIS — Z7984 Long term (current) use of oral hypoglycemic drugs: Secondary | ICD-10-CM | POA: Diagnosis not present

## 2015-12-06 DIAGNOSIS — I2510 Atherosclerotic heart disease of native coronary artery without angina pectoris: Secondary | ICD-10-CM | POA: Diagnosis present

## 2015-12-06 DIAGNOSIS — I110 Hypertensive heart disease with heart failure: Principal | ICD-10-CM | POA: Insufficient documentation

## 2015-12-06 DIAGNOSIS — I447 Left bundle-branch block, unspecified: Secondary | ICD-10-CM | POA: Diagnosis not present

## 2015-12-06 DIAGNOSIS — Z9889 Other specified postprocedural states: Secondary | ICD-10-CM | POA: Diagnosis not present

## 2015-12-06 DIAGNOSIS — E1165 Type 2 diabetes mellitus with hyperglycemia: Secondary | ICD-10-CM

## 2015-12-06 DIAGNOSIS — Z955 Presence of coronary angioplasty implant and graft: Secondary | ICD-10-CM | POA: Diagnosis not present

## 2015-12-06 DIAGNOSIS — I25701 Atherosclerosis of coronary artery bypass graft(s), unspecified, with angina pectoris with documented spasm: Secondary | ICD-10-CM

## 2015-12-06 DIAGNOSIS — Z8249 Family history of ischemic heart disease and other diseases of the circulatory system: Secondary | ICD-10-CM | POA: Diagnosis not present

## 2015-12-06 DIAGNOSIS — I509 Heart failure, unspecified: Secondary | ICD-10-CM

## 2015-12-06 DIAGNOSIS — I5043 Acute on chronic combined systolic (congestive) and diastolic (congestive) heart failure: Secondary | ICD-10-CM | POA: Diagnosis not present

## 2015-12-06 DIAGNOSIS — Z9119 Patient's noncompliance with other medical treatment and regimen: Secondary | ICD-10-CM | POA: Diagnosis not present

## 2015-12-06 DIAGNOSIS — Z6836 Body mass index (BMI) 36.0-36.9, adult: Secondary | ICD-10-CM | POA: Diagnosis not present

## 2015-12-06 DIAGNOSIS — IMO0002 Reserved for concepts with insufficient information to code with codable children: Secondary | ICD-10-CM | POA: Diagnosis present

## 2015-12-06 DIAGNOSIS — E1159 Type 2 diabetes mellitus with other circulatory complications: Secondary | ICD-10-CM | POA: Insufficient documentation

## 2015-12-06 DIAGNOSIS — I5042 Chronic combined systolic (congestive) and diastolic (congestive) heart failure: Secondary | ICD-10-CM | POA: Diagnosis present

## 2015-12-06 DIAGNOSIS — R079 Chest pain, unspecified: Secondary | ICD-10-CM | POA: Insufficient documentation

## 2015-12-06 DIAGNOSIS — Z7982 Long term (current) use of aspirin: Secondary | ICD-10-CM | POA: Diagnosis not present

## 2015-12-06 DIAGNOSIS — R0601 Orthopnea: Secondary | ICD-10-CM | POA: Diagnosis not present

## 2015-12-06 DIAGNOSIS — Z79899 Other long term (current) drug therapy: Secondary | ICD-10-CM | POA: Insufficient documentation

## 2015-12-06 DIAGNOSIS — I255 Ischemic cardiomyopathy: Secondary | ICD-10-CM | POA: Insufficient documentation

## 2015-12-06 DIAGNOSIS — E785 Hyperlipidemia, unspecified: Secondary | ICD-10-CM | POA: Diagnosis not present

## 2015-12-06 DIAGNOSIS — I25709 Atherosclerosis of coronary artery bypass graft(s), unspecified, with unspecified angina pectoris: Secondary | ICD-10-CM | POA: Diagnosis not present

## 2015-12-06 DIAGNOSIS — Z9111 Patient's noncompliance with dietary regimen: Secondary | ICD-10-CM | POA: Insufficient documentation

## 2015-12-06 DIAGNOSIS — R0609 Other forms of dyspnea: Secondary | ICD-10-CM

## 2015-12-06 DIAGNOSIS — Z8673 Personal history of transient ischemic attack (TIA), and cerebral infarction without residual deficits: Secondary | ICD-10-CM | POA: Diagnosis not present

## 2015-12-06 DIAGNOSIS — I10 Essential (primary) hypertension: Secondary | ICD-10-CM | POA: Diagnosis present

## 2015-12-06 HISTORY — DX: Dizziness and giddiness: R42

## 2015-12-06 HISTORY — DX: Morbid (severe) obesity due to excess calories: E66.01

## 2015-12-06 HISTORY — DX: Chronic systolic (congestive) heart failure: I50.22

## 2015-12-06 HISTORY — DX: Atherosclerotic heart disease of native coronary artery without angina pectoris: I25.10

## 2015-12-06 HISTORY — DX: Type 2 diabetes mellitus without complications: E11.9

## 2015-12-06 HISTORY — DX: Ischemic cardiomyopathy: I25.5

## 2015-12-06 HISTORY — DX: Hypertensive heart disease without heart failure: I11.9

## 2015-12-06 LAB — GLUCOSE, CAPILLARY
GLUCOSE-CAPILLARY: 225 mg/dL — AB (ref 65–99)
GLUCOSE-CAPILLARY: 160 mg/dL — AB (ref 65–99)
GLUCOSE-CAPILLARY: 163 mg/dL — AB (ref 65–99)

## 2015-12-06 LAB — BRAIN NATRIURETIC PEPTIDE: B Natriuretic Peptide: 97 pg/mL (ref 0.0–100.0)

## 2015-12-06 LAB — ECHOCARDIOGRAM COMPLETE
AO mean calculated velocity dopler: 112 cm/s
AOPV: 0.28 m/s
AV Area VTI: 0.89 cm2
AV Area mean vel: 0.84 cm2
AV VEL mean LVOT/AV: 0.27
AVAREAMEANVIN: 0.31 cm2/m2
AVAREAVTIIND: 0.33 cm2/m2
AVG: 6 mmHg
AVPG: 10 mmHg
AVPKVEL: 156 cm/s
CHL CUP AV PEAK INDEX: 0.33
CHL CUP AV VEL: 0.9
CHL CUP MV DEC (S): 253
E decel time: 253 msec
E/e' ratio: 9.06
FS: 15 % — AB (ref 28–44)
Height: 77 in
IVS/LV PW RATIO, ED: 0.75
LA ID, A-P, ES: 55 mm
LA diam index: 2.02 cm/m2
LA vol index: 30.8 mL/m2
LA vol: 83.9 mL
LAVOLA4C: 59.2 mL
LEFT ATRIUM END SYS DIAM: 55 mm
LV E/e'average: 9.06
LV PW d: 14.6 mm — AB (ref 0.6–1.1)
LV TDI E'MEDIAL: 4.13
LVEEMED: 9.06
LVELAT: 6.31 cm/s
LVOT VTI: 8.9 cm
LVOT area: 3.14 cm2
LVOT peak VTI: 0.29 cm
LVOT peak vel: 44.3 cm/s
LVOTD: 20 mm
LVOTSV: 28 mL
MV pk A vel: 86.3 m/s
MVPKEVEL: 57.2 m/s
TDI e' lateral: 6.31
VTI: 31 cm
Valve area index: 0.33
Valve area: 0.9 cm2
Weight: 5056 oz

## 2015-12-06 LAB — BASIC METABOLIC PANEL
Anion gap: 7 (ref 5–15)
BUN: 22 mg/dL — AB (ref 6–20)
CALCIUM: 9.3 mg/dL (ref 8.9–10.3)
CHLORIDE: 101 mmol/L (ref 101–111)
CO2: 28 mmol/L (ref 22–32)
CREATININE: 1.15 mg/dL (ref 0.61–1.24)
GFR calc non Af Amer: >60 mL/min (ref >60)
Glucose, Bld: 263 mg/dL — ABNORMAL HIGH (ref 65–99)
Potassium: 3.9 mmol/L (ref 3.5–5.1)
SODIUM: 136 mmol/L (ref 135–145)

## 2015-12-06 LAB — CBC
HCT: 40.9 % (ref 40.0–52.0)
Hemoglobin: 14.2 g/dL (ref 13.0–18.0)
MCH: 29.8 pg (ref 26.0–34.0)
MCHC: 34.8 g/dL (ref 32.0–36.0)
MCV: 85.8 fL (ref 80.0–100.0)
PLATELETS: 157 10*3/uL (ref 150–440)
RBC: 4.77 MIL/uL (ref 4.40–5.90)
RDW: 14.6 % — AB (ref 11.5–14.5)
WBC: 7.5 10*3/uL (ref 3.8–10.6)

## 2015-12-06 LAB — TROPONIN I

## 2015-12-06 MED ORDER — PRAVASTATIN SODIUM 40 MG PO TABS
40.0000 mg | ORAL_TABLET | Freq: Every day | ORAL | Status: DC
  Administered 2015-12-07: 40 mg via ORAL
  Filled 2015-12-06: qty 1

## 2015-12-06 MED ORDER — ASPIRIN 81 MG PO CHEW
81.0000 mg | CHEWABLE_TABLET | Freq: Every day | ORAL | Status: DC
  Administered 2015-12-07 – 2015-12-09 (×3): 81 mg via ORAL
  Filled 2015-12-06 (×3): qty 1

## 2015-12-06 MED ORDER — FUROSEMIDE 10 MG/ML IJ SOLN
80.0000 mg | Freq: Two times a day (BID) | INTRAMUSCULAR | Status: DC
  Administered 2015-12-06 – 2015-12-08 (×4): 80 mg via INTRAVENOUS
  Filled 2015-12-06 (×4): qty 8

## 2015-12-06 MED ORDER — METFORMIN HCL 500 MG PO TABS
500.0000 mg | ORAL_TABLET | Freq: Two times a day (BID) | ORAL | Status: DC
  Administered 2015-12-06 – 2015-12-09 (×6): 500 mg via ORAL
  Filled 2015-12-06 (×6): qty 1

## 2015-12-06 MED ORDER — LISINOPRIL 10 MG PO TABS
40.0000 mg | ORAL_TABLET | Freq: Every day | ORAL | Status: DC
  Administered 2015-12-07 – 2015-12-09 (×3): 40 mg via ORAL
  Filled 2015-12-06 (×4): qty 4

## 2015-12-06 MED ORDER — ACETAMINOPHEN 325 MG PO TABS
650.0000 mg | ORAL_TABLET | Freq: Four times a day (QID) | ORAL | Status: DC | PRN
  Administered 2015-12-06 – 2015-12-09 (×7): 650 mg via ORAL
  Filled 2015-12-06 (×8): qty 2

## 2015-12-06 MED ORDER — AMLODIPINE BESYLATE 5 MG PO TABS
10.0000 mg | ORAL_TABLET | Freq: Every day | ORAL | Status: DC
  Administered 2015-12-07 – 2015-12-09 (×3): 10 mg via ORAL
  Filled 2015-12-06 (×3): qty 2

## 2015-12-06 MED ORDER — ASPIRIN 81 MG PO CHEW
243.0000 mg | CHEWABLE_TABLET | Freq: Once | ORAL | Status: AC
  Administered 2015-12-06: 243 mg via ORAL
  Filled 2015-12-06: qty 3

## 2015-12-06 MED ORDER — ENOXAPARIN SODIUM 40 MG/0.4ML ~~LOC~~ SOLN
40.0000 mg | SUBCUTANEOUS | Status: DC
  Administered 2015-12-06 – 2015-12-08 (×3): 40 mg via SUBCUTANEOUS
  Filled 2015-12-06 (×3): qty 0.4

## 2015-12-06 MED ORDER — SODIUM CHLORIDE 0.9% FLUSH
3.0000 mL | Freq: Two times a day (BID) | INTRAVENOUS | Status: DC
  Administered 2015-12-06 – 2015-12-09 (×6): 3 mL via INTRAVENOUS

## 2015-12-06 MED ORDER — FUROSEMIDE 10 MG/ML IJ SOLN
40.0000 mg | Freq: Three times a day (TID) | INTRAMUSCULAR | Status: DC
  Administered 2015-12-06: 40 mg via INTRAVENOUS
  Filled 2015-12-06: qty 4

## 2015-12-06 MED ORDER — POTASSIUM CHLORIDE CRYS ER 10 MEQ PO TBCR
10.0000 meq | EXTENDED_RELEASE_TABLET | Freq: Two times a day (BID) | ORAL | Status: DC
Start: 2015-12-06 — End: 2015-12-09
  Administered 2015-12-06 – 2015-12-09 (×6): 10 meq via ORAL
  Filled 2015-12-06 (×6): qty 1

## 2015-12-06 MED ORDER — HEPARIN SODIUM (PORCINE) 5000 UNIT/ML IJ SOLN
5000.0000 [IU] | Freq: Three times a day (TID) | INTRAMUSCULAR | Status: DC
Start: 2015-12-06 — End: 2015-12-06

## 2015-12-06 MED ORDER — INSULIN ASPART 100 UNIT/ML ~~LOC~~ SOLN
0.0000 [IU] | Freq: Three times a day (TID) | SUBCUTANEOUS | Status: DC
  Administered 2015-12-06 – 2015-12-07 (×4): 2 [IU] via SUBCUTANEOUS
  Administered 2015-12-08: 1 [IU] via SUBCUTANEOUS
  Administered 2015-12-08 (×2): 2 [IU] via SUBCUTANEOUS
  Administered 2015-12-09: 5 [IU] via SUBCUTANEOUS
  Administered 2015-12-09: 2 [IU] via SUBCUTANEOUS
  Filled 2015-12-06 (×2): qty 2
  Filled 2015-12-06: qty 5
  Filled 2015-12-06 (×3): qty 2
  Filled 2015-12-06: qty 1
  Filled 2015-12-06 (×2): qty 2

## 2015-12-06 MED ORDER — CARVEDILOL 12.5 MG PO TABS
25.0000 mg | ORAL_TABLET | Freq: Two times a day (BID) | ORAL | Status: DC
  Administered 2015-12-06 – 2015-12-09 (×6): 25 mg via ORAL
  Filled 2015-12-06 (×6): qty 2

## 2015-12-06 NOTE — ED Provider Notes (Signed)
[facility] Emergency Department Provider Note  ____________________________________________  Time seen: Approximately 9:36 AM  I have reviewed the triage vital signs and the nursing notes.   HISTORY  Chief Complaint Chest Pain; Shortness of Breath; and Hypertension    HPI [name] is a 70 y.o. male the history of CAD s/p stent placement, CHF, DM, HTN, HL, and morbid obesity presenting for chest pain and shortness of breath. The patient reports that for approximately one week he has had a progressively worsening orthopnea and is no longer able to lay flat. "I feel like I'm drowning and suffocating." He also has had 2-3 days of a constant chest pressure across the entire chest, radiating down both arms, and into the left neck and jaw. He denies any diaphoresis, pleuritic pain, leg swelling or calf pain, palpitations, lightheadedness or syncope.He does have generalized weakness and fatigue. No cough or cold symptoms, fever or chills. He denies any weight gain.   Past Medical History  Diagnosis Date  . Diabetes mellitus   . Hypertension   . Hyperlipidemia   . Cerebrovascular accident (HCC)     No residual weakness  . CHF (congestive heart failure) (HCC)   . Coronary artery disease     Patient Active Problem List   Diagnosis Date Noted  . 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 Back pain 08/06/2013  . Hyperlipidemia 04/07/2013  . Alcohol abuse 04/07/2013  . Chronic systolic CHF (congestive heart failure), NYHA class 3 (HCC) 07/04/2012  . SOB (shortness of breath) 07/04/2012  . Cough 07/04/2012  . CAD (coronary artery disease) 07/04/2012  . Essential hypertension 07/04/2012    Past Surgical History  Procedure Laterality Date  . Knee surgery      left  . Knee cartilage surgery      right  . Cardiac catheterization  2013    showing 60% LAD disease after stent, no stent placed based on FFR pressure wire results  . Cardiac catheterization  2011   showing 90% LAD stenosis s/p stent placement  . Total hip arthroplasty Left 2014  . Lasik Bilateral 2015    eye sugeries  . Coronary angioplasty with stent placement      Current Outpatient Rx  Name  Route  Sig  Dispense  Refill  . amLODipine (NORVASC) 10 MG tablet   Oral   Take 1 tablet (10 mg total) by mouth daily.   90 tablet   3   . aspirin 81 MG tablet   Oral   Take 81 mg by mouth daily.         . carvedilol (COREG) 25 MG tablet   Oral   Take 1 tablet (25 mg total) by mouth 2 (two) times daily with a meal.   90 tablet   3   . furosemide (LASIX) 80 MG tablet   Oral   Take 80 mg by mouth 2 (two) times daily.         Marland Kitchen. lisinopril (PRINIVIL,ZESTRIL) 40 MG tablet   Oral   Take 40 mg by mouth daily.         . metFORMIN (GLUCOPHAGE) 500 MG tablet   Oral   Take 1 tablet (500 mg total) by mouth 2 (two) times daily with a meal.   180 tablet   3   . potassium chloride (K-DUR) 10 MEQ tablet   Oral   Take 1 tablet (10 mEq total) by mouth 2 (two) times daily as needed.   60 tablet  6   . pravastatin (PRAVACHOL) 40 MG tablet   Oral   Take 1 tablet (40 mg total) by mouth daily.   90 tablet   3     Allergies Review of patient's allergies indicates no known allergies.  Family History  Problem Relation Age of Onset  . Family history unknown: Yes    Social History Social History  Substance Use Topics  . Smoking status: Never Smoker   . Smokeless tobacco: None  . Alcohol Use: No    Review of Systems Constitutional: No fever/chills.No lightheadedness or syncope. Eyes: No visual changes. ENT: No sore throat. No congestion or rhinorrhea. Cardiovascular: Positive chest pain. Denies palpitations. Respiratory: Positive orthopnea and exertional shortness of breath.  No cough. Gastrointestinal: No abdominal pain.  No nausea, no vomiting.  No diarrhea.  No constipation. Genitourinary: Negative for dysuria. Musculoskeletal: Negative for back pain. No lower  extremity swelling. No calf pain. Skin: Negative for rash. Neurological: Negative for headaches. No focal numbness, tingling or weakness.   10-point ROS otherwise negative.  ____________________________________________   PHYSICAL EXAM:  VITAL SIGNS: ED Triage Vitals  Enc Vitals Group     BP 12/06/15 0806 130/68 mmHg     Pulse Rate 12/06/15 0806 82     Resp 12/06/15 0806 22     Temp 12/06/15 0806 98.1 F (36.7 C)     Temp Source 12/06/15 0806 Oral     SpO2 12/06/15 0806 98 %     Weight 12/06/15 0806 316 lb (143.337 kg)     Height 12/06/15 0806 6\' 5"  (1.956 m)     Head Cir --      Peak Flow --      Pain Score 12/06/15 0808 6     Pain Loc --      Pain Edu? --      Excl. in GC? --     Constitutional: Alert and oriented. Chronically ill appearing but in no acute distress. Answers questions appropriately. Eyes: Conjunctivae are normal.  EOMI. No scleral icterus. Head: Atraumatic. Nose: No congestion/rhinnorhea. Mouth/Throat: Mucous membranes are moist.  Neck: No stridor.  Supple.  No JVD. Cardiovascular: Normal rate, regular rhythm. No murmurs, rubs or gallops.  Respiratory: Normal respiratory effort.  No accessory muscle use or retractions. Lungs CTAB.  No wheezes, rales or ronchi. Gastrointestinal: Obese. Soft, nontender and nondistended.  No guarding or rebound.  No peritoneal signs. Musculoskeletal: No LE edema. No ttp in the calves or palpable cords.  Negative Homan's sign. Neurologic:  A&Ox3.  Speech is clear.  Face and smile are symmetric.  EOMI.  Moves all extremities well. Skin:  Skin is warm, dry and intact. No rash noted. Psychiatric: Mood and affect are normal. Speech and behavior are normal.  Normal judgement  ____________________________________________   LABS (all labs ordered are listed, but only abnormal results are displayed)  Labs Reviewed  BASIC METABOLIC PANEL - Abnormal; Notable for the following:    Glucose, Bld 263 (*)    BUN 22 (*)    All other  components within normal limits  CBC - Abnormal; Notable for the following:    RDW 14.6 (*)    All other components within normal limits  TROPONIN I  BRAIN NATRIURETIC PEPTIDE   ____________________________________________  EKG  ED ECG REPORT I, Rockne MenghiniNorman, Anne-Caroline, the attending physician, personally viewed and interpreted this ECG.   Date: 12/06/2015  EKG Time: 807  Rate: 75  Rhythm: normal sinus rhythm  Axis: Normal  Intervals:none  ST&T  Change: No ST elevation MI.  ____________________________________________  RADIOLOGY  Dg Chest 2 View  12/06/2015  CLINICAL DATA:  Chest pain and shortness of breath EXAM: CHEST  2 VIEW COMPARISON:  05/27/2012 FINDINGS: Stable borderline cardiomegaly. Negative aortic and hilar contours. Right diaphragm eventration. There is no edema, consolidation, effusion, or pneumothorax. Diffuse spondylosis. Chronic smooth right pleural thickening. IMPRESSION: No active cardiopulmonary disease. Electronically Signed   By: Marnee Spring M.D.   On: 12/06/2015 09:06    ____________________________________________   PROCEDURES  Procedure(s) performed: None  Critical Care performed: No ____________________________________________   INITIAL IMPRESSION / ASSESSMENT AND PLAN / ED COURSE  Pertinent labs & imaging results that were available during my care of the patient were reviewed by me and considered in my medical decision making (see chart for details).  70 y.o. male with a history of CAD, CHF presenting with chest pain, orthopnea, and exertional shortness of breath over the last several days. The patient does not have any ischemic changes on his EKG, and his troponin is negative. It is possible that the patient has CHF, but I do not see any significant evidence of fluid overload on my examination, nor has he had any weight gain. His chest x-ray does not show any pulmonary edema. We'll get a BNP for further evaluation. Given the patient's morbid  obesity, I wonder if he may be suffering from sleep apnea. He has never been tested for this in the past. However, given his significant cardiac history, he will require admission for further cardiac evaluation.  ____________________________________________  FINAL CLINICAL IMPRESSION(S) / ED DIAGNOSES  Final diagnoses:  Chest pain, unspecified chest pain type  Orthopnea  Dyspnea on exertion      NEW MEDICATIONS STARTED DURING THIS VISIT:  New Prescriptions   No medications on file     Rockne Menghini, MD 12/06/15 [phone]

## 2015-12-06 NOTE — Progress Notes (Signed)
*  PRELIMINARY RESULTS* Echocardiogram 2D Echocardiogram has been performed.  Cristela BlueHege, Kylar Leonhardt 12/06/2015, 2:15 PM

## 2015-12-06 NOTE — Progress Notes (Signed)
Took over care at 16:50 from Amy, pt ambulatory in room, pain relieving from tylenol, on room air, denies chest pain, uneventful shift.

## 2015-12-06 NOTE — ED Notes (Signed)
Pt arrives to ER via POV c/o chest pain across chest, tingling across chest, SOB, high blood pressure x 3 days. Pt alert and oriented X4, active, cooperative, pt in NAD. RR even and unlabored, color WNL.

## 2015-12-06 NOTE — H&P (Signed)
[employer] [facility] - [place] at [facility]   PATIENT NAME: Thomas FillersGary Goodroe    MR#:  [id number]  DATE OF BIRTH:  1946/04/01  DATE OF ADMISSION:  12/06/2015  PRIMARY CARE PHYSICIAN: Thornton PapasSykes, Larry Justain, PA-C   REQUESTING/REFERRING PHYSICIAN: Sharma CovertNorman  CHIEF COMPLAINT:   Chief Complaint  Patient presents with  . Chest Pain  . Shortness of Breath  . Hypertension    HISTORY OF PRESENT ILLNESS: [name]  is a 70 y.o. male with a known history of Diabetes, hypertension, hyperlipidemia, cerebrovascular accident, CHF- ejection fraction 15% as per the echocardiogram in December 2013, coronary artery disease status post stent. He takes all his medications regularly, last time he had seen a cardiologist Dr.Gollan Was 8-9 months ago. For last 2-3 weeks he has worsening shortness of breath with minimal exertion off just walking a few steps and also he feels very short of breath while he is to lie down at the nighttime, but feels comfortable if he sits upright in his bed. He denies any leg swellings. A few days ago at home when he checked his blood pressure it was 18 0/110, he tried calling his cardiologist office but did not get any reply. Today morning he had tightness in his chest which is central and radiating to both arms and going to his neck also and so concerned with this he came to emergency room. Because of his strong positive history and suspicion of having worsening of his cardiac status he is given as admission.  PAST MEDICAL HISTORY:   Past Medical History  Diagnosis Date  . Diabetes mellitus   . Hypertension   . Hyperlipidemia   . Cerebrovascular accident (HCC)     No residual weakness  . CHF (congestive heart failure) (HCC)   . Coronary artery disease     PAST SURGICAL HISTORY: Past Surgical History  Procedure Laterality Date  . Knee surgery      left  . Knee cartilage surgery      right  . Cardiac catheterization  2013    showing 60% LAD disease after stent,  no stent placed based on FFR pressure wire results  . Cardiac catheterization  2011    showing 90% LAD stenosis s/p stent placement  . Total hip arthroplasty Left 2014  . Lasik Bilateral 2015    eye sugeries  . Coronary angioplasty with stent placement      SOCIAL HISTORY:  Social History  Substance Use Topics  . Smoking status: Never Smoker   . Smokeless tobacco: Not on file  . Alcohol Use: No    FAMILY HISTORY:  Family History  Problem Relation Age of Onset  . Family history unknown: Yes    DRUG ALLERGIES: No Known Allergies  REVIEW OF SYSTEMS:   CONSTITUTIONAL: No fever, fatigue or weakness.  EYES: No blurred or double vision.  EARS, NOSE, AND THROAT: No tinnitus or ear pain.  RESPIRATORY: No cough, shortness of breath, wheezing or hemoptysis.  CARDIOVASCULAR: positive for chest pain, orthopnea,no edema.  GASTROINTESTINAL: No nausea, vomiting, diarrhea or abdominal pain.  GENITOURINARY: No dysuria, hematuria.  ENDOCRINE: No polyuria, nocturia,  HEMATOLOGY: No anemia, easy bruising or bleeding SKIN: No rash or lesion. MUSCULOSKELETAL: No joint pain or arthritis.   NEUROLOGIC: No tingling, numbness, weakness.  PSYCHIATRY: No anxiety or depression.   MEDICATIONS AT HOME:  Prior to Admission medications   Medication Sig Start Date End Date Taking? Authorizing Provider  amLODipine (NORVASC) 10 MG tablet Take 1 tablet (10 mg  total) by mouth daily. 03/16/14  Yes Antonieta Iba, MD  aspirin 81 MG tablet Take 81 mg by mouth daily.   Yes Historical Provider, MD  carvedilol (COREG) 25 MG tablet Take 1 tablet (25 mg total) by mouth 2 (two) times daily with a meal. 03/16/14  Yes Antonieta Iba, MD  furosemide (LASIX) 80 MG tablet Take 80 mg by mouth 2 (two) times daily.   Yes Historical Provider, MD  lisinopril (PRINIVIL,ZESTRIL) 40 MG tablet Take 40 mg by mouth daily.   Yes Historical Provider, MD  metFORMIN (GLUCOPHAGE) 500 MG tablet Take 1 tablet (500 mg total) by mouth 2  (two) times daily with a meal. 01/14/15  Yes Antonieta Iba, MD  potassium chloride (K-DUR) 10 MEQ tablet Take 1 tablet (10 mEq total) by mouth 2 (two) times daily as needed. 03/16/14  Yes Antonieta Iba, MD  pravastatin (PRAVACHOL) 40 MG tablet Take 1 tablet (40 mg total) by mouth daily. 03/16/14  Yes Antonieta Iba, MD      PHYSICAL EXAMINATION:   VITAL SIGNS: Blood pressure 130/68, pulse 82, temperature 98.1 F (36.7 C), temperature source Oral, resp. rate 22, height  (1.956 m), weight 143.337 kg (316 lb), SpO2 98 %.  GENERAL:  70 y.o.-year-old patient lying in the bed with no acute distress.  EYES: Pupils equal, round, reactive to light and accommodation. No scleral icterus. Extraocular muscles intact.  HEENT: Head atraumatic, normocephalic. Oropharynx and nasopharynx clear.  NECK:  Supple, no jugular venous distention. No thyroid enlargement, no tenderness.  LUNGS: Normal breath sounds bilaterally, no wheezing, rales,rhonchi or crepitation. No use of accessory muscles of respiration.  CARDIOVASCULAR: S1, S2 normal. No murmurs, rubs, or gallops.  ABDOMEN: Soft, nontender, nondistended. Bowel sounds present. No organomegaly or mass.  EXTREMITIES: No pedal edema, cyanosis, or clubbing.  NEUROLOGIC: Cranial nerves II through XII are intact. Muscle strength 5/5 in all extremities. Sensation intact. Gait not checked.  PSYCHIATRIC: The patient is alert and oriented x 3.  SKIN: No obvious rash, lesion, or ulcer.   LABORATORY PANEL:   CBC  Recent Labs Lab 12/06/15 0813  WBC 7.5  HGB 14.2  HCT 40.9  PLT 157  MCV 85.8  MCH 29.8  MCHC 34.8  RDW 14.6*   ------------------------------------------------------------------------------------------------------------------  Chemistries   Recent Labs Lab 12/06/15 0813  NA 136  K 3.9  CL 101  CO2 28  GLUCOSE 263*  BUN 22*  CREATININE 1.15  CALCIUM 9.3    ------------------------------------------------------------------------------------------------------------------ estimated creatinine clearance is 96.3 mL/min (by C-G formula based on Cr of 1.15). ------------------------------------------------------------------------------------------------------------------ No results for input(s): TSH, T4TOTAL, T3FREE, THYROIDAB in the last 72 hours.  Invalid input(s): FREET3   Coagulation profile No results for input(s): INR, PROTIME in the last 168 hours. ------------------------------------------------------------------------------------------------------------------- No results for input(s): DDIMER in the last 72 hours. -------------------------------------------------------------------------------------------------------------------  Cardiac Enzymes  Recent Labs Lab 12/06/15 0813  TROPONINI <0.03   ------------------------------------------------------------------------------------------------------------------ Invalid input(s): POCBNP  ---------------------------------------------------------------------------------------------------------------  Urinalysis    Component Value Date/Time   COLORURINE Yellow 05/27/2012 2109   APPEARANCEUR Hazy 05/27/2012 2109   LABSPEC 1.016 05/27/2012 2109   PHURINE 6.0 05/27/2012 2109   GLUCOSEU Negative 05/27/2012 2109   HGBUR 1+ 05/27/2012 2109   BILIRUBINUR Negative 05/27/2012 2109   KETONESUR Negative 05/27/2012 2109   PROTEINUR 100 mg/dL 16/03/9603 5409   NITRITE Negative 05/27/2012 2109   LEUKOCYTESUR Negative 05/27/2012 2109     RADIOLOGY: Dg Chest 2 View  12/06/2015  CLINICAL DATA:  Chest pain  and shortness of breath EXAM: CHEST  2 VIEW COMPARISON:  05/27/2012 FINDINGS: Stable borderline cardiomegaly. Negative aortic and hilar contours. Right diaphragm eventration. There is no edema, consolidation, effusion, or pneumothorax. Diffuse spondylosis. Chronic smooth right pleural  thickening. IMPRESSION: No active cardiopulmonary disease. Electronically Signed   By: Marnee SpringJonathon  Watts M.D.   On: 12/06/2015 09:06    EKG: Orders placed or performed during the hospital encounter of 12/06/15  . ED EKG within 10 minutes  . ED EKG within 10 minutes    IMPRESSION AND PLAN:  * Chest pain and orthopnea   I reviewed patient's previous records and in December 2013 his ejection fraction was 15%.   He claims taking all the medications regularly on time. Last time visit with cardiologist was almost 1 year ago.   Most likely this presentation is worsening in his cardiac function or it is as a result of sleep apnea.   Currently he does not have any acute findings of congestive heart failure.   I will keep him on cardiac monitoring and follow serial troponin, check his echocardiogram   I will also keep him on fluid restriction and give him IV Lasix.   Cardiology consult with his primary cardiologist.  * Diabetes   Continue medication, keep on sliding scale coverage.  * Hypertension   Continue home medication.  * Coronary artery disease   Continue home meds.    All the records are reviewed and case discussed with ED provider. Management plans discussed with the patient, family and they are in agreement.  CODE STATUS:Full code  Code Status History    This patient does not have a recorded code status. Please follow your organizational policy for patients in this situation.     Plan discussed with patient and his sister was present in the room at the time of admission.  TOTAL TIME TAKING CARE OF THIS PATIENT: 50 minutes.    Altamese DillingVACHHANI, Ilah Boule M.D on 12/06/2015   Between 7am to 6pm - Pager - [phone]  After 6pm go to [email or web address] - password [email or web address] [facility]  [facility]  Office  [phone]  CC: Primary care physician; Thornton PapasSykes, Larry Justain, PA-C   Note: This dictation was prepared with Dragon dictation along with smaller phrase technology. Any  transcriptional errors that result from this process are unintentional.

## 2015-12-06 NOTE — Progress Notes (Signed)
Patient is alert and oriented, vss, no complaints of pain.  Diuresing.  Echo done.  Resting comfortably, family at bedside.

## 2015-12-06 NOTE — Consult Note (Signed)
Cardiology Consult    Patient ID: [name] MRN: [id number], DOB/AGE: 06/25/1945   Admit date: 12/06/2015 Date of Consult: 12/06/2015  Primary Physician: Thornton PapasSykes, Larry Justain, PA-C Primary Cardiologist: Concha Se. Gollan, MD  Requesting Provider: Ozella RocksV. Vachhani, MD  Patient Profile    70 y/o ? with a h/o CAD, HTN, HL, obesity, systolic CHF, and ICM, who [facility] with a 2 wk h/o progressive dyspnea and rest/exertional c/p.  Past Medical History   Past Medical History  Diagnosis Date  . Type II diabetes mellitus (HCC)   . Hypertensive heart disease   . Hyperlipidemia   . Cerebrovascular accident [facility](HCC)     a. No residual weakness  . Chronic systolic CHF (congestive heart failure) (HCC)     a. 05/2012 Echo: EF 15%, impaired LV relaxation. Mod concentric LVH. mod dil LA. Severe global HK.  . Ischemic cardiomyopathy     a. 05/2012 Echo: EF 15%.  Marland Kitchen. CAD (coronary artery disease)     a. 11/2009 Cath: LAD 90 (3.0x12 Xience DES);  b. 09/2011 LAD stent ok, 60d (nl FFR-->Med Rx).  . Morbid obesity (HCC)   . Dizziness     a. 01/2015 MRI: chronic left basal ganglia infarct, abnormal appearance of basialar and distal vertebral arteries suggestive of flow limiting stenoses, CTA recommended.    Past Surgical History  Procedure Laterality Date  . Knee surgery      left  . Knee cartilage surgery      right  . Cardiac catheterization  2013    showing 60% LAD disease after stent, no stent placed based on FFR pressure wire results  . Cardiac catheterization  2011    showing 90% LAD stenosis s/p stent placement  . Total hip arthroplasty Left 2014  . Lasik Bilateral 2015    eye sugeries  . Coronary angioplasty with stent placement       Allergies  No Known Allergies  History of Present Illness    70 y/o ? with the above complex PMH including CAD s/p PCI/DES of the LAD in 2011.  EF was also noted to be depressed in 2013 @ 15%.  Cath @ that time revealed patent LAD stent with  moderate, nonobstructive more distal LAD dzs with nl FFR. He has been medically managed since.  He also has a h/o HTN, HL, DM, obesity, CVA, and remote ETOH abuse.  He was last seen in clinic in 02/2016.  At that time, he was having significant dizziness.  Prior MRI suggested vertebrobasilar dzs and CTA was ordered to further evaluate.  Pt never showed for f/u imaging.  He says that since then, his dizziness has improved.  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  He has slowed down some in the past 6 months, taking less jobs as he nears retirement, hopefully next year.  He was in his USOH until about 2 wks ago, when he began to note increasing upper abd girth, DOE, wt gain, and orthopnea.  He also noted rest and exertional substernal chest heaviness with radiation to bilateral shoulders and arms, now occurring with minimal activity.  His wt, which typically runs about 300 lbs, is now 316 lbs.  Though he has been compliant with meds, he lives alone and as a result, often eats out or brings home fast food.  He doesn't think that his overall sodium intake, which he assumes is very high, has changed recently.  Due to progressive c/p, dyspnea, and orthopnea, he presented to the ED this AM.  There, ECG was notable for lateral TWI, though this isn't new.  BNP only 97.  CXR w/o acute findings and troponins nl.  He has been admitted and ordered IV lasix.  We've been asked to eval.  He currently reports mild chest discomfort.  Inpatient Medications    . [START ON 12/07/2015] amLODipine  10 mg Oral Daily  . aspirin  81 mg Oral Daily  . carvedilol  25 mg Oral BID WC  . furosemide  40 mg Intravenous TID  . heparin  5,000 Units Subcutaneous Q8H  . insulin aspart  0-9 Units Subcutaneous TID WC  . [START ON 12/07/2015] lisinopril  40 mg Oral Daily  . metFORMIN  500 mg Oral BID WC  . potassium chloride  10 mEq Oral BID  . [START ON 12/07/2015] pravastatin  40 mg Oral  Daily  . sodium chloride flush  3 mL Intravenous Q12H    Family History    Family History  Problem Relation Age of Onset  . CAD Father     died @ 5660 of MI - CAD dx in his 3850's.  Marland Kitchen. CAD Brother     several brothers died of MI  . CAD Sister     died of MI.    Social History    Social History   Social History  . Marital Status: Single    Spouse Name: N/A  . Number of Children: 1  . Years of Education: 12   Occupational History  .      unempoloyed   Social History Main Topics  . Smoking status: Never Smoker   . Smokeless tobacco: Not on file  . Alcohol Use: No  . Drug Use: No  . Sexual Activity: Not on file   Other Topics Concern  . Not on file   Social History Narrative   Lives in [place] by himself.  Does not routinely exercise.  Works as a Marine scientistbounty hunter, which can require a fair amt of exertion.  Often eats out - fast food.  Does not watch salt in diet - adds salt.   Caffeine use- some     Review of Systems    General:  No chills, fever, night sweats. +++ 16 lbs wt gain. Cardiovascular:  +++ chest pain, +++ dyspnea on exertion, no edema, +++ orthopnea, no palpitations, paroxysmal nocturnal dyspnea. Dermatological: No rash, lesions/masses Respiratory: No cough, +++ dyspnea Urologic: No hematuria, dysuria Abdominal:   No nausea, vomiting, diarrhea, bright red blood per rectum, melena, or hematemesis.  +++ constipation. Neurologic:  No visual changes, wkns, changes in mental status. All other systems reviewed and are otherwise negative except as noted above.  Physical Exam    Blood pressure 142/74, pulse 68, temperature 98 F (36.7 C), temperature source Oral, resp. rate 18, height 6\' 5"  (1.956 m), weight 316 lb (143.337 kg), SpO2 98 %.  General: Pleasant, NAD Psych: Normal affect. Neuro: Alert and oriented X 3. Moves all extremities spontaneously. HEENT: Normal  Neck: Supple without bruits.  Neck girth makes JVP difficult to gauge.  Does not appear to be  significantly elevated. Lungs:  Resp regular and unlabored, CTA. Heart: RRR, distant, no s3, s4, or murmurs. Abdomen: obese, protuberant, relatively soft and nondistended, BS + x 4.  Extremities: No clubbing, cyanosis or edema. DP/PT/Radials 2+ and equal bilaterally.  Labs     Recent Labs  12/06/15 0813 12/06/15 1121  TROPONINI <  0.03 <0.03   Lab Results  Component Value Date   WBC 7.5 12/06/2015   HGB 14.2 12/06/2015   HCT 40.9 12/06/2015   MCV 85.8 12/06/2015   PLT 157 12/06/2015     Recent Labs Lab 12/06/15 0813  NA 136  K 3.9  CL 101  CO2 28  BUN 22*  CREATININE 1.15  CALCIUM 9.3  GLUCOSE 263*   Lab Results  Component Value Date   CHOL 251* 02/22/2015   HDL 31* 02/22/2015   LDLCALC 174* 02/22/2015   TRIG 231* 02/22/2015     Radiology Studies    Dg Chest 2 View  12/06/2015  CLINICAL DATA:  Chest pain and shortness of breath EXAM: CHEST  2 VIEW COMPARISON:  05/27/2012 FINDINGS: Stable borderline cardiomegaly. Negative aortic and hilar contours. Right diaphragm eventration. There is no edema, consolidation, effusion, or pneumothorax. Diffuse spondylosis. Chronic smooth right pleural thickening. IMPRESSION: No active cardiopulmonary disease. Electronically Signed   By: Marnee Spring M.D.   On: 12/06/2015 09:06    ECG & Cardiac Imaging    RSR, 75, LAE, inc LBBB, lateral TWI - no acute st/t changes.  Assessment & Plan    1.  Acute on chronic systolic CHF/ICM:  Pt presents with a 2 wk h/o progressive exertional dyspnea, orthopnea, wt gain (16 lbs), and increasing abd girth.  He has also had rest and exertional chest tightness.  He admits to dietary noncompliance, frequently eating out/partaking in fast food and also regularly adding salt to food, though his overall salt consumption hasn't changed recently.  Previous EF in 2013 was 15%.  Exam is somewhat difficult 2/2 body habitus, though I suspect he holds a fair amt of fluid in his abdomen.  Continue IV  diuresis.  Echo pending.  I suspect, given c/p, that he will require R & L heart cath once he is able to lie flat.  Cont  blocker, acei.  Was previously on entresto but was switched to lisinopril (he's not sure who switched him).  2.  [place]/CAD:  Prior h/o LAD stenting in 2011 with moderate LAD dzs on cath in 2013.  Presents w/ 2 wk h/o progressive rest and exertional angina, radiating to bilateral shoulders/arms.  ECG non-acute and trop negative so far.  Cont asa,  blocker, acei, statin.  Plan R & L heart cath once he is able to lie flat - ? Wednesday.  3.  Hypertensive Heart Disease:  BP elevated @ home recently.  Diurese.  Follow.  Cont  blocker, ACEI, CCB.  4.  HL:  Cont statin.  F/u lipids as LDL was 173 last year.  May need higher potency statin.  5.  DM II:  Hold metformin.  SSI.  Signed, Nicolasa Ducking, NP 12/06/2015, 12:45 PM

## 2015-12-07 ENCOUNTER — Observation Stay: Payer: Medicare Other

## 2015-12-07 DIAGNOSIS — R079 Chest pain, unspecified: Secondary | ICD-10-CM | POA: Diagnosis not present

## 2015-12-07 DIAGNOSIS — R0601 Orthopnea: Secondary | ICD-10-CM | POA: Diagnosis not present

## 2015-12-07 DIAGNOSIS — I25701 Atherosclerosis of coronary artery bypass graft(s), unspecified, with angina pectoris with documented spasm: Secondary | ICD-10-CM | POA: Diagnosis not present

## 2015-12-07 DIAGNOSIS — I5023 Acute on chronic systolic (congestive) heart failure: Secondary | ICD-10-CM | POA: Diagnosis not present

## 2015-12-07 LAB — CBC
HEMATOCRIT: 39 % — AB (ref 40.0–52.0)
HEMOGLOBIN: 13.7 g/dL (ref 13.0–18.0)
MCH: 29.8 pg (ref 26.0–34.0)
MCHC: 35.2 g/dL (ref 32.0–36.0)
MCV: 84.7 fL (ref 80.0–100.0)
Platelets: 148 10*3/uL — ABNORMAL LOW (ref 150–440)
RBC: 4.61 MIL/uL (ref 4.40–5.90)
RDW: 14.3 % (ref 11.5–14.5)
WBC: 7 10*3/uL (ref 3.8–10.6)

## 2015-12-07 LAB — BASIC METABOLIC PANEL
ANION GAP: 9 (ref 5–15)
BUN: 25 mg/dL — ABNORMAL HIGH (ref 6–20)
CHLORIDE: 100 mmol/L — AB (ref 101–111)
CO2: 27 mmol/L (ref 22–32)
Calcium: 9.2 mg/dL (ref 8.9–10.3)
Creatinine, Ser: 1.2 mg/dL (ref 0.61–1.24)
GFR calc non Af Amer: >60 mL/min (ref >60)
Glucose, Bld: 167 mg/dL — ABNORMAL HIGH (ref 65–99)
Potassium: 3.7 mmol/L (ref 3.5–5.1)
Sodium: 136 mmol/L (ref 135–145)

## 2015-12-07 LAB — GLUCOSE, CAPILLARY
GLUCOSE-CAPILLARY: 144 mg/dL — AB (ref 65–99)
Glucose-Capillary: 170 mg/dL — ABNORMAL HIGH (ref 65–99)
Glucose-Capillary: 168 mg/dL — ABNORMAL HIGH (ref 65–99)
Glucose-Capillary: 194 mg/dL — ABNORMAL HIGH (ref 65–99)

## 2015-12-07 MED ORDER — IBUPROFEN 400 MG PO TABS: 400.0000 mg | ORAL_TABLET | Freq: Four times a day (QID) | ORAL | Status: DC | PRN

## 2015-12-07 MED ORDER — ATORVASTATIN CALCIUM 20 MG PO TABS
40.0000 mg | ORAL_TABLET | Freq: Every day | ORAL | Status: DC
  Administered 2015-12-08: 40 mg via ORAL
  Filled 2015-12-07: qty 2

## 2015-12-07 NOTE — Progress Notes (Signed)
[employer] [facility] [place] [facility]   PATIENT NAME: [name]    MR#:  [id number]  DATE OF BIRTH:  March 11, 1946  SUBJECTIVE:  CHIEF COMPLAINT:   Chief Complaint  Patient presents with  . Chest Pain  . Shortness of Breath  . Hypertension   Came with orthopnea and shortness of breath with minimal exertion.   Feels much better after IV diuresis, still have some shortness of breath. He was drinking a lot of liquids at home almost 2-3 gallons a day.  REVIEW OF SYSTEMS:  CONSTITUTIONAL: No fever, fatigue or weakness.  EYES: No blurred or double vision.  EARS, NOSE, AND THROAT: No tinnitus or ear pain.  RESPIRATORY: No cough, shortness of breath, wheezing or hemoptysis.  CARDIOVASCULAR: No chest pain, orthopnea, edema.  GASTROINTESTINAL: No nausea, vomiting, diarrhea or abdominal pain.  GENITOURINARY: No dysuria, hematuria.  ENDOCRINE: No polyuria, nocturia,  HEMATOLOGY: No anemia, easy bruising or bleeding SKIN: No rash or lesion. MUSCULOSKELETAL: No joint pain or arthritis.   NEUROLOGIC: No tingling, numbness, weakness.  PSYCHIATRY: No anxiety or depression.   ROS  DRUG ALLERGIES:  No Known Allergies  VITALS:  Blood pressure 120/64, pulse 61, temperature 98.5 F (36.9 C), temperature source Oral, resp. rate 20, height 6\' 5"  (1.956 m), weight 131.906 kg (290 lb 12.8 oz), SpO2 97 %.  PHYSICAL EXAMINATION:   GENERAL: 70 y.o.-year-old patient lying in the bed with no acute distress.  EYES: Pupils equal, round, reactive to light and accommodation. No scleral icterus. Extraocular muscles intact.  HEENT: Head atraumatic, normocephalic. Oropharynx and nasopharynx clear.  NECK: Supple, no jugular venous distention. No thyroid enlargement, no tenderness.  LUNGS: Normal breath sounds bilaterally, no wheezing, rales,rhonchi or crepitation. No use of accessory muscles of respiration.  CARDIOVASCULAR: S1, S2 normal. No murmurs, rubs, or gallops.  ABDOMEN:  Soft, nontender, nondistended. Bowel sounds present. No organomegaly or mass.  EXTREMITIES: No pedal edema, cyanosis, or clubbing.  NEUROLOGIC: Cranial nerves II through XII are intact. Muscle strength 5/5 in all extremities. Sensation intact. Gait not checked.  PSYCHIATRIC: The patient is alert and oriented x 3.  SKIN: No obvious rash, lesion, or ulcer.   Physical Exam LABORATORY PANEL:   CBC  Recent Labs Lab 12/07/15 0607  WBC 7.0  HGB 13.7  HCT 39.0*  PLT 148*   ------------------------------------------------------------------------------------------------------------------  Chemistries   Recent Labs Lab 12/07/15 0607  NA 136  K 3.7  CL 100*  CO2 27  GLUCOSE 167*  BUN 25*  CREATININE 1.20  CALCIUM 9.2   ------------------------------------------------------------------------------------------------------------------  Cardiac Enzymes  Recent Labs Lab 12/06/15 1121 12/06/15 1534  TROPONINI <0.03 <0.03   ------------------------------------------------------------------------------------------------------------------  RADIOLOGY:  Dg Chest 2 View  12/07/2015  CLINICAL DATA:  One week of shortness of breath; history of CHF, diabetes, coronary artery disease with stent placement, morbid obesity. EXAM: CHEST  2 VIEW COMPARISON:  Chest x-ray of December 06, 2015 FINDINGS: The lungs are well-expanded. There is no focal infiltrate. There is no pleural effusion. There is stable pleural thickening along the right lateral thoracic wall. The cardiac silhouette is top-normal in size. The pulmonary vascularity is normal. The mediastinum is normal in width. The bony thorax exhibits no acute abnormalities. IMPRESSION: Stable chest.  No acute cardiopulmonary abnormality. Electronically Signed   By: David  SwazilandJordan M.D.   On: 12/07/2015 07:42   Dg Chest 2 View  12/06/2015  CLINICAL DATA:  Chest pain and shortness of breath EXAM: CHEST  2 VIEW COMPARISON:  05/27/2012 FINDINGS:  Stable  borderline cardiomegaly. Negative aortic and hilar contours. Right diaphragm eventration. There is no edema, consolidation, effusion, or pneumothorax. Diffuse spondylosis. Chronic smooth right pleural thickening. IMPRESSION: No active cardiopulmonary disease. Electronically Signed   By: Marnee SpringJonathon  Watts M.D.   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  He claims taking all the medications regularly on time. Last time visit with cardiologist was almost 1 year ago.  He was non compliant with fluid restrictions.   I will keep him on cardiac monitoring and follow serial troponin, checked his echocardiogram   on fluid restriction and give him IV Lasix.  Cardiology consult with his primary cardiologist appreciated.   Have good diuresis.  * Diabetes  Continue medication, keep on sliding scale coverage.  * Hypertension  Continue home medication.  * Coronary artery disease  Continue home meds.  * hyperlipidemia   Atorvastatin.   All the records are reviewed and case discussed with Care Management/Social Workerr. Management plans discussed with the patient, family and they are in agreement.  CODE STATUS: full  TOTAL TIME TAKING CARE OF THIS PATIENT: 35 minutes.     POSSIBLE D/C IN 1-2 DAYS, DEPENDING ON CLINICAL CONDITION.   Altamese DillingVACHHANI, Cameryn Schum M.D on 12/07/2015   Between 7am to 6pm - Pager - [phone]  After 6pm go to  [email or web address] - password [email or web address] [facility]  [facility] [facility]  Office  [phone]  CC: Primary care physician; Thornton PapasSykes, Larry Justain, PA-C  Note: This dictation was prepared with Dragon dictation along with smaller phrase technology. Any transcriptional errors that result from this process are unintentional.

## 2015-12-07 NOTE — Progress Notes (Signed)
Notified Dr. Madelon LipsVacchani that patient has arthritic pain unrelieved with tylenol, patient takes aleve at home, per MD okay to enter ibuprofen q6h prn mild/moderate pain. Telephone order.

## 2015-12-07 NOTE — Care Management Obs Status (Signed)
MEDICARE OBSERVATION STATUS NOTIFICATION   Patient Details  Name: [name] MRN: [id number] Date of Birth: March 21, 1946   Medicare Observation Status Notification Given:  Yes    Marily MemosLisa M Amaris Garrette, RN 12/07/2015, 8:47 AM

## 2015-12-07 NOTE — Progress Notes (Signed)
Initial Heart Failure Clinic appointment scheduled on December 20, 2015 at 1:00pm. Thank you.

## 2015-12-07 NOTE — Progress Notes (Signed)
Pt is alert and oriented x 4, ambulatory in room, good urine output, family at bedside, patient continues to have shortness of breath with exertion, on IV lasix bid, fair appetite, chest xray unremarkable. Vital signs stable.

## 2015-12-07 NOTE — Progress Notes (Addendum)
Patient: [name] / Admit Date: 12/06/2015 / Date of Encounter: 12/07/2015, 9:47 AM   Subjective: Difficult night, very uncomfortable in bed secondary to arthritis Improving shortness of breath, still not at baseline Feels he had good urination in the past 24 hours Has not ambulated He does detail significant fluid intake at home, no dietary discretion  reports he has been compliant with Lasix 80 mg twice a day at home   Review of Systems: Review of Systems  Constitutional: Positive for malaise/fatigue.  Respiratory: Positive for shortness of breath.   Cardiovascular: Negative.   Gastrointestinal: Negative.   Musculoskeletal: Positive for back pain and joint pain.  Neurological: Negative.   Psychiatric/Behavioral: Negative.   All other systems reviewed and are negative.    Objective: Telemetry:  Physical Exam: Blood pressure 121/79, pulse 73, temperature 97.7 F (36.5 C), temperature source Oral, resp. rate 18, height 6\' 5"  (1.956 m), weight 290 lb 12.8 oz (131.906 kg), SpO2 95 %. Body mass index is 34.48 kg/(m^2). General: Well developed, well nourished, in no acute distress.Obese  Head: Normocephalic, atraumatic, sclera non-icteric, no xanthomas, nares are without discharge. Neck: Negative for carotid bruits. JVPcould not be estimated  Lungs:Mildly decreased breath sounds throughout,  without wheezes, rales, or rhonchi. Breathing is unlabored. Heart: RRR S1 S2 without murmurs, rubs, or gallops.  Abdomen: Soft, non-tender, non-distended with normoactive bowel sounds. No rebound/guarding. Extremities: No clubbing or cyanosis. No edema. Distal pedal pulses are 2+ and equal bilaterally. Neuro: Alert and oriented X 3. Moves all extremities spontaneously. Psych:  Responds to questions appropriately with a normal affect.   Intake/Output Summary (Last 24 hours) at 12/07/15 0947 Last data filed at 12/07/15 0500  Gross per 24 hour  Intake    480 ml  Output   3525 ml    Net  -3045 ml    Inpatient Medications:  . amLODipine  10 mg Oral Daily  . aspirin  81 mg Oral Daily  . carvedilol  25 mg Oral BID WC  . enoxaparin (LOVENOX) injection  40 mg Subcutaneous Q24H  . furosemide  80 mg Intravenous BID  . insulin aspart  0-9 Units Subcutaneous TID WC  . lisinopril  40 mg Oral Daily  . metFORMIN  500 mg Oral BID WC  . potassium chloride  10 mEq Oral BID  . pravastatin  40 mg Oral Daily  . sodium chloride flush  3 mL Intravenous Q12H   Infusions:    Labs:  Recent Labs  12/06/15 0813 12/07/15 0607  NA 136 136  K 3.9 3.7  CL 101 100*  CO2 28 27  GLUCOSE 263* 167*  BUN 22* 25*  CREATININE 1.15 1.20  CALCIUM 9.3 9.2   No results for input(s): AST, ALT, ALKPHOS, BILITOT, PROT, ALBUMIN in the last 72 hours.  Recent Labs  12/06/15 0813 12/07/15 0607  WBC 7.5 7.0  HGB 14.2 13.7  HCT 40.9 39.0*  MCV 85.8 84.7  PLT 157 148*    Recent Labs  12/06/15 0813 12/06/15 1121 12/06/15 1534  TROPONINI <0.03 <0.03 <0.03   Invalid input(s): POCBNP No results for input(s): HGBA1C in the last 72 hours.   Weights: Filed Weights   12/06/15 0806 12/06/15 1257 12/07/15 0514  Weight: 316 lb (143.337 kg) 297 lb 8 oz (134.945 kg) 290 lb 12.8 oz (131.906 kg)     Radiology/Studies:  Dg Chest 2 View  12/07/2015  CLINICAL DATA:  One week of shortness of breath; history of CHF, diabetes, coronary  artery disease with stent placement, morbid obesity. EXAM: CHEST  2 VIEW COMPARISON:  Chest x-ray of December 06, 2015 FINDINGS: The lungs are well-expanded. There is no focal infiltrate. There is no pleural effusion. There is stable pleural thickening along the right lateral thoracic wall. The cardiac silhouette is top-normal in size. The pulmonary vascularity is normal. The mediastinum is normal in width. The bony thorax exhibits no acute abnormalities. IMPRESSION: Stable chest.  No acute cardiopulmonary abnormality. Electronically Signed   By: David  SwazilandJordan M.D.   On:  12/07/2015 07:42   Dg Chest 2 View  12/06/2015  CLINICAL DATA:  Chest pain and shortness of breath EXAM: CHEST  2 VIEW COMPARISON:  05/27/2012 FINDINGS: Stable borderline cardiomegaly. Negative aortic and hilar contours. Right diaphragm eventration. There is no edema, consolidation, effusion, or pneumothorax. Diffuse spondylosis. Chronic smooth right pleural thickening. IMPRESSION: No active cardiopulmonary disease. Electronically Signed   By: Marnee SpringJonathon  Watts M.D.   On: 12/06/2015 09:06     Assessment and Plan  70 y.o. male  --- Acute on chronic systolic CHF Suspect secondary to dietary indiscretion high fluid intake, Reports he is taking Lasix 80 mg twice a day at home Agree with continued aggressive Lasix IV twice a day, likely will need 1 more day  3 L out in the past 24 hours, still with residual shortness of breath   would continue Beta blocker, ACE inhibitor, statin  Unable to afford entresto in the past Recommend we hold off on cardiac catheterization for now  echocardiogram with depressed ejection fraction, unchanged from several years ago   --- Vertebral artery disease Previous MRA MRI August 2016 detailing abnormal appearance of the basilar and distal vertebral arteries suggesting flow limiting stenosis. He did not follow-up for repeat CTA secondary to financial issues  --- Diabetes Poorly controlled, hemoglobin A1c 9.2 Needs close follow-up with endocrinology Poor diet  ---CAD, PCI Presenting with chest tightness, likely secondary to CHF exacerbation No sx in past 24 hours  Long discussion concerning CHF management, his diet and need for diet changes Also discussed CHF management at home, need to titrate diuretics based on weight gain Greater than 50% was spent in counseling and coordination of care with patient Total encounter time 35 minutes or more  Signed, Dossie Arbourim Tyanne Derocher, MD, Ph.D. [facility] 12/07/2015, 9:47 AM

## 2015-12-08 DIAGNOSIS — I5042 Chronic combined systolic (congestive) and diastolic (congestive) heart failure: Secondary | ICD-10-CM

## 2015-12-08 DIAGNOSIS — I5043 Acute on chronic combined systolic (congestive) and diastolic (congestive) heart failure: Secondary | ICD-10-CM

## 2015-12-08 DIAGNOSIS — I255 Ischemic cardiomyopathy: Secondary | ICD-10-CM | POA: Diagnosis present

## 2015-12-08 DIAGNOSIS — I25118 Atherosclerotic heart disease of native coronary artery with other forms of angina pectoris: Secondary | ICD-10-CM

## 2015-12-08 LAB — GLUCOSE, CAPILLARY
GLUCOSE-CAPILLARY: 174 mg/dL — AB (ref 65–99)
GLUCOSE-CAPILLARY: 150 mg/dL — AB (ref 65–99)
GLUCOSE-CAPILLARY: 152 mg/dL — AB (ref 65–99)
Glucose-Capillary: 151 mg/dL — ABNORMAL HIGH (ref 65–99)

## 2015-12-08 MED ORDER — FUROSEMIDE 40 MG PO TABS
80.0000 mg | ORAL_TABLET | Freq: Two times a day (BID) | ORAL | Status: DC
  Administered 2015-12-08 – 2015-12-09 (×2): 80 mg via ORAL
  Filled 2015-12-08 (×3): qty 2

## 2015-12-08 MED ORDER — DOCUSATE SODIUM 100 MG PO CAPS
100.0000 mg | ORAL_CAPSULE | Freq: Two times a day (BID) | ORAL | Status: DC
  Administered 2015-12-08 – 2015-12-09 (×2): 100 mg via ORAL
  Filled 2015-12-08 (×2): qty 1

## 2015-12-08 MED ORDER — SENNA 8.6 MG PO TABS
1.0000 | ORAL_TABLET | Freq: Every day | ORAL | Status: DC | PRN
  Administered 2015-12-08: 8.6 mg via ORAL
  Filled 2015-12-08: qty 1

## 2015-12-08 MED ORDER — SPIRONOLACTONE 25 MG PO TABS
12.5000 mg | ORAL_TABLET | Freq: Every day | ORAL | Status: DC
  Administered 2015-12-08 – 2015-12-09 (×2): 12.5 mg via ORAL
  Filled 2015-12-08 (×2): qty 1

## 2015-12-08 NOTE — Progress Notes (Signed)
Patient: [name] / Admit Date: 12/06/2015 / Date of Encounter: 12/08/2015, 9:25 AM   Subjective: Slept better last night. Improving SOB. UOP of 2.1 L for the past 24 hours and 5.5 L for the admission. Renal function stable as of 6/27. No acute overnight events.   Review of Systems: Review of Systems  Constitutional: Positive for malaise/fatigue. Negative for fever, chills, weight loss and diaphoresis.  HENT: Negative for congestion.   Eyes: Negative for discharge and redness.  Respiratory: Positive for shortness of breath. Negative for cough, sputum production and wheezing.   Cardiovascular: Negative for chest pain, palpitations, orthopnea, claudication, leg swelling and PND.  Gastrointestinal: Negative for nausea and vomiting.  Musculoskeletal: Positive for back pain and joint pain. Negative for myalgias, falls and neck pain.  Skin: Negative for rash.  Neurological: Negative for dizziness, sensory change, speech change, focal weakness, loss of consciousness and weakness.  Psychiatric/Behavioral: The patient is not nervous/anxious.   All other systems reviewed and are negative.   Objective: Telemetry: NSR, 70's bpm Physical Exam: Blood pressure 123/74, pulse 79, temperature 97.9 F (36.6 C), temperature source Oral, resp. rate 16, height 6\' 5"  (1.956 m), weight 294 lb 11.2 oz (133.675 kg), SpO2 100 %. Body mass index is 34.94 kg/(m^2). General: Well developed, well nourished, in no acute distress. Head: Normocephalic, atraumatic, sclera non-icteric, no xanthomas, nares are without discharge. Neck: Negative for carotid bruits. JVP not elevated. Lungs: Mildly decreased breath sounds throughout. Breathing is unlabored. Heart: RRR S1 S2 without murmurs, rubs, or gallops.  Abdomen: Soft, non-tender, non-distended with normoactive bowel sounds. No rebound/guarding. Extremities: No clubbing or cyanosis. No edema. Distal pedal pulses are 2+ and equal bilaterally. Neuro: Alert and  oriented X 3. Moves all extremities spontaneously. Psych:  Responds to questions appropriately with a normal affect.   Intake/Output Summary (Last 24 hours) at 12/08/15 0925 Last data filed at 12/08/15 [id number]  Gross per 24 hour  Intake    840 ml  Output   3300 ml  Net  -2460 ml    Inpatient Medications:  . amLODipine  10 mg Oral Daily  . aspirin  81 mg Oral Daily  . atorvastatin  40 mg Oral q1800  . carvedilol  25 mg Oral BID WC  . enoxaparin (LOVENOX) injection  40 mg Subcutaneous Q24H  . furosemide  80 mg Intravenous BID  . insulin aspart  0-9 Units Subcutaneous TID WC  . lisinopril  40 mg Oral Daily  . metFORMIN  500 mg Oral BID WC  . potassium chloride  10 mEq Oral BID  . sodium chloride flush  3 mL Intravenous Q12H  . spironolactone  12.5 mg Oral Daily   Infusions:    Labs:  Recent Labs  12/06/15 0813 12/07/15 0607  NA 136 136  K 3.9 3.7  CL 101 100*  CO2 28 27  GLUCOSE 263* 167*  BUN 22* 25*  CREATININE 1.15 1.20  CALCIUM 9.3 9.2   No results for input(s): AST, ALT, ALKPHOS, BILITOT, PROT, ALBUMIN in the last 72 hours.  Recent Labs  12/06/15 0813 12/07/15 0607  WBC 7.5 7.0  HGB 14.2 13.7  HCT 40.9 39.0*  MCV 85.8 84.7  PLT 157 148*    Recent Labs  12/06/15 0813 12/06/15 1121 12/06/15 1534  TROPONINI <0.03 <0.03 <0.03   Invalid input(s): POCBNP No results for input(s): HGBA1C in the last 72 hours.   Weights: Filed Weights   12/06/15 1257 12/07/15 0514 12/08/15 0452  Weight:  297 lb 8 oz (134.945 kg) 290 lb 12.8 oz (131.906 kg) 294 lb 11.2 oz (133.675 kg)     Radiology/Studies:  Dg Chest 2 View  12/07/2015  CLINICAL DATA:  One week of shortness of breath; history of CHF, diabetes, coronary artery disease with stent placement, morbid obesity. EXAM: CHEST  2 VIEW COMPARISON:  Chest x-ray of December 06, 2015 FINDINGS: The lungs are well-expanded. There is no focal infiltrate. There is no pleural effusion. There is stable pleural thickening along  the right lateral thoracic wall. The cardiac silhouette is top-normal in size. The pulmonary vascularity is normal. The mediastinum is normal in width. The bony thorax exhibits no acute abnormalities. IMPRESSION: Stable chest.  No acute cardiopulmonary abnormality. Electronically Signed   By: David  Swazil[place]Jordan M.D.   On: 12/07/2015 07:42   Dg Chest 2 View  12/06/2015  CLINICAL DATA:  Chest pain and shortness of breath EXAM: CHEST  2 VIEW COMPARISON:  05/27/2012 FINDINGS: Stable borderline cardiomegaly. Negative aortic and hilar contours. Right diaphragm eventration. There is no edema, consolidation, effusion, or pneumothorax. Diffuse spondylosis. Chronic smooth right pleural thickening. IMPRESSION: No active cardiopulmonary disease. Electronically Signed   By: Marnee SpringJonathon  Watts M.D.   On: 12/06/2015 09:06     Assessment and Plan  Principal Problem:   Acute on chronic combined systolic and diastolic congestive heart failure, NYHA class 3 (HCC) Active Problems:   Chronic combined systolic and diastolic CHF, NYHA class 3 (HCC)   Essential hypertension   Orthopnea   Chest pain   Dyspnea on exertion   Coronary artery disease involving native coronary artery of native heart   Uncontrolled type 2 diabetes mellitus with circulatory disorder, without long-term current use of insulin (HCC)   Pain in the chest   Cardiomyopathy, ischemic    1. Acute on chronic combined systolic and diastolic CHF: -Improving -Continue IV Lasix for this morning and change to PO Lasix this afternoon -May possibly be discharge this evening after observed response to PO Lasix, though most likely will need to stay until 6/29 -Minus 5.5 L for the admission on IV Lasix -Continue beta blocker and ACEi -Unable to afford Entresto, perhaps the reps could help with this as an outpatient -Add spironolactone  -No plans for inpatient cardiac cath as his reduced EF is known. Defer to primary cardiologist for possible outpatient  workup -CHF education   2. CAD s/p PCI to LAD in 2011: -Presented with chest tightness, improved -Felt to be 2/2 volume overload -Troponin negative x 3 -Continue Coreg and aspirin   3. Remaining active problems in the problem list addressed by primary service     Signed, Eula Listenyan Orvan Papadakis, PA-C [facility] Pager: [phone] 12/08/2015, 9:25 AM

## 2015-12-08 NOTE — Progress Notes (Signed)
[facility] - [place] at [facility]   PATIENT NAME: Thomas FillersGary Markuson    MR#:  [id number]  DATE OF BIRTH:  Sep 13, 1945  SUBJECTIVE:  CHIEF COMPLAINT:   Chief Complaint  Patient presents with  . Chest Pain  . Shortness of Breath  . Hypertension   Came with orthopnea and shortness of breath with minimal exertion.   Feels much better after IV diuresis, still have some shortness of breath. He was drinking a lot of liquids at home almost 2-3 gallons a day.   Cardio suggested to switch to oral lasix and monitor one more day for diuresis.  REVIEW OF SYSTEMS:  CONSTITUTIONAL: No fever, fatigue or weakness.  EYES: No blurred or double vision.  EARS, NOSE, AND THROAT: No tinnitus or ear pain.  RESPIRATORY: No cough, shortness of breath, wheezing or hemoptysis.  CARDIOVASCULAR: No chest pain, orthopnea, edema.  GASTROINTESTINAL: No nausea, vomiting, diarrhea or abdominal pain.  GENITOURINARY: No dysuria, hematuria.  ENDOCRINE: No polyuria, nocturia,  HEMATOLOGY: No anemia, easy bruising or bleeding SKIN: No rash or lesion. MUSCULOSKELETAL: No joint pain or arthritis.   NEUROLOGIC: No tingling, numbness, weakness.  PSYCHIATRY: No anxiety or depression.   ROS  DRUG ALLERGIES:  No Known Allergies  VITALS:  Blood pressure 112/69, pulse 68, temperature 98.6 F (37 C), temperature source Oral, resp. rate 14, height 6\' 5"  (1.956 m), weight 133.675 kg (294 lb 11.2 oz), SpO2 97 %.  PHYSICAL EXAMINATION:   GENERAL: 70 y.o.-year-old patient lying in the bed with no acute distress.  EYES: Pupils equal, round, reactive to light and accommodation. No scleral icterus. Extraocular muscles intact.  HEENT: Head atraumatic, normocephalic. Oropharynx and nasopharynx clear.  NECK: Supple, no jugular venous distention. No thyroid enlargement, no tenderness.  LUNGS: Normal breath sounds bilaterally, no wheezing, rales,rhonchi or crepitation. No use of accessory muscles of  respiration.  CARDIOVASCULAR: S1, S2 normal. No murmurs, rubs, or gallops.  ABDOMEN: Soft, nontender, nondistended. Bowel sounds present. No organomegaly or mass.  EXTREMITIES: No pedal edema, cyanosis, or clubbing.  NEUROLOGIC: Cranial nerves II through XII are intact. Muscle strength 5/5 in all extremities. Sensation intact. Gait not checked.  PSYCHIATRIC: The patient is alert and oriented x 3.  SKIN: No obvious rash, lesion, or ulcer.   Physical Exam LABORATORY PANEL:   CBC  Recent Labs Lab 12/07/15 0607  WBC 7.0  HGB 13.7  HCT 39.0*  PLT 148*   ------------------------------------------------------------------------------------------------------------------  Chemistries   Recent Labs Lab 12/07/15 0607  NA 136  K 3.7  CL 100*  CO2 27  GLUCOSE 167*  BUN 25*  CREATININE 1.20  CALCIUM 9.2   ------------------------------------------------------------------------------------------------------------------  Cardiac Enzymes  Recent Labs Lab 12/06/15 1121 12/06/15 1534  TROPONINI <0.03 <0.03   ------------------------------------------------------------------------------------------------------------------  RADIOLOGY:  Dg Chest 2 View  12/07/2015  CLINICAL DATA:  One week of shortness of breath; history of CHF, diabetes, coronary artery disease with stent placement, morbid obesity. EXAM: CHEST  2 VIEW COMPARISON:  Chest x-ray of December 06, 2015 FINDINGS: The lungs are well-expanded. There is no focal infiltrate. There is no pleural effusion. There is stable pleural thickening along the right lateral thoracic wall. The cardiac silhouette is top-normal in size. The pulmonary vascularity is normal. The mediastinum is normal in width. The bony thorax exhibits no acute abnormalities. IMPRESSION: Stable chest.  No acute cardiopulmonary abnormality. Electronically Signed   By: David  SwazilandJordan M.D.   On: 12/07/2015 07:42    ASSESSMENT AND PLAN:   Principal Problem:  Acute on chronic combined systolic and diastolic congestive heart failure, NYHA class 3 (HCC) Active Problems:   Chronic combined systolic and diastolic CHF, NYHA class 3 (HCC)   Essential hypertension   Orthopnea   Chest pain   Dyspnea on exertion   Coronary artery disease involving native coronary artery of native heart   Uncontrolled type 2 diabetes mellitus with circulatory disorder, without long-term current use of insulin (HCC)   Pain in the chest   Cardiomyopathy, ischemic   * Chest pain and orthopnea  Ac on Ch systolic CHF- EF 16%20% previous records and in December 2013 his ejection fraction was 15%.  He claims taking all the medications regularly on time. Last time visit with cardiologist was almost 1 year ago.  He was non compliant with fluid restrictions.   I will keep him on cardiac monitoring and follow serial troponin, checked his echocardiogram   on fluid restriction and give him IV Lasix.  Cardiology consult with his primary cardiologist appreciated.   Have good diuresis.   Switch to oral lasix. Monitor for diuresis.  * Diabetes  Continue medication, keep on sliding scale coverage.  * Hypertension  Continue home medication.   Added spironolactone.  * Coronary artery disease  Continue home meds.  * hyperlipidemia   Atorvastatin.   All the records are reviewed and case discussed with Care Management/Social Workerr. Management plans discussed with the patient, family and they are in agreement.  CODE STATUS: full  TOTAL TIME TAKING CARE OF THIS PATIENT: 35 minutes.   POSSIBLE D/C IN 1-2 DAYS, DEPENDING ON CLINICAL CONDITION.   Altamese DillingVACHHANI, Bob Eastwood M.D on 12/08/2015   Between 7am to 6pm - Pager - [phone]  After 6pm go to [email or web address] - password [email or web address] [facility]  [facility] [place] [facility]  Office  [phone]  CC: Primary care physician; Thornton PapasSykes, Larry Justain, PA-C  Note: This dictation was prepared with Dragon dictation along with  smaller phrase technology. Any transcriptional errors that result from this process are unintentional.

## 2015-12-08 NOTE — Care Management (Signed)
Spoke with patient regarding affordability of his Entresto. In the presence of his wife- says that he was taken off of Entresto one year ago due to side effect of dizziness.  Was put back on "my usual heart meds at that time."  There are no concerns at the present time with affordability of medications.  Reached out to heart Failure Clinic to determine if patient is followed.  may be of benefit.  [facility]

## 2015-12-08 NOTE — Progress Notes (Signed)
Patient requested medication to help have a bowel movement[facility]

## 2015-12-09 ENCOUNTER — Telehealth: Payer: Self-pay | Admitting: Cardiovascular Disease

## 2015-12-09 LAB — BASIC METABOLIC PANEL
Anion gap: 9 (ref 5–15)
BUN: 32 mg/dL — AB (ref 6–20)
CALCIUM: 9.3 mg/dL (ref 8.9–10.3)
CHLORIDE: 99 mmol/L — AB (ref 101–111)
CO2: 27 mmol/L (ref 22–32)
Creatinine, Ser: 1.39 mg/dL — ABNORMAL HIGH (ref 0.61–1.24)
GFR, EST AFRICAN AMERICAN: 59 mL/min — AB (ref >60)
GFR, EST NON AFRICAN AMERICAN: 51 mL/min — AB (ref >60)
Glucose, Bld: 296 mg/dL — ABNORMAL HIGH (ref 65–99)
Potassium: 3.8 mmol/L (ref 3.5–5.1)
SODIUM: 135 mmol/L (ref 135–145)

## 2015-12-09 LAB — GLUCOSE, CAPILLARY
GLUCOSE-CAPILLARY: 254 mg/dL — AB (ref 65–99)
Glucose-Capillary: 159 mg/dL — ABNORMAL HIGH (ref 65–99)

## 2015-12-09 MED ORDER — METOLAZONE 2.5 MG PO TABS: 2.5000 mg | ORAL_TABLET | Freq: Every day | ORAL | Status: DC | PRN

## 2015-12-09 MED ORDER — ATORVASTATIN CALCIUM 40 MG PO TABS: 40.0000 mg | ORAL_TABLET | Freq: Every day | ORAL | Status: DC

## 2015-12-09 MED ORDER — SPIRONOLACTONE 25 MG PO TABS: 12.5000 mg | ORAL_TABLET | Freq: Every day | ORAL | Status: DC

## 2015-12-09 NOTE — Telephone Encounter (Signed)
TCM ph [facility] for chest pain  Needs 2 weeks from 12/09/15   Scheduled 7 - 31 @ 2 pm with Alycia Rossettiyan and added to waitlist

## 2015-12-09 NOTE — Discharge Instructions (Signed)
Heart Failure Clinic appointment on December 20, 2015 at 1:00pm with Clarisa Kindredina Hackney, FNP. Please call [phone] to reschedule.   Daily total fluid intake is advised to be up to 1500 ml.

## 2015-12-09 NOTE — Progress Notes (Signed)
Patient: [name] / Admit Date: 12/06/2015 / Date of Encounter: 12/09/2015, 9:41 AM   Subjective: No acute overnight events. SOB improved. UOP 6.1 L for the admission. Renal function starting to bump s/p IV diuresis this admission.   Review of Systems: Review of Systems  Constitutional: Positive for malaise/fatigue. Negative for fever, chills, weight loss and diaphoresis.  HENT: Negative for congestion.   Eyes: Negative for discharge and redness.  Respiratory: Positive for shortness of breath.   Cardiovascular: Negative.   Gastrointestinal: Negative for nausea and vomiting.  Musculoskeletal: Negative for falls.  Neurological: Negative.  Negative for weakness.  Endo/Heme/Allergies: Does not bruise/bleed easily.  Psychiatric/Behavioral: Negative.     Objective: Telemetry: NSR, 70's bpm Physical Exam: Blood pressure 130/73, pulse 72, temperature 98.7 F (37.1 C), temperature source Oral, resp. rate 18, height 6\' 5"  (1.956 m), weight 308 lb 4.8 oz (139.844 kg), SpO2 97 %. Body mass index is 36.55 kg/(m^2). General: Well developed, well nourished, in no acute distress. Head: Normocephalic, atraumatic, sclera non-icteric, no xanthomas, nares are without discharge. Neck: Negative for carotid bruits. JVP not elevated. Lungs: Improved breath sounds. Breathing is unlabored. Heart: RRR S1 S2 without murmurs, rubs, or gallops.  Abdomen: Obese, soft, non-tender, non-distended with normoactive bowel sounds. No rebound/guarding. Extremities: No clubbing or cyanosis. No edema. Distal pedal pulses are 2+ and equal bilaterally. Neuro: Alert and oriented X 3. Moves all extremities spontaneously. Psych:  Responds to questions appropriately with a normal affect.   Intake/Output Summary (Last 24 hours) at 12/09/15 0941 Last data filed at 12/09/15 0818  Gross per 24 hour  Intake   1320 ml  Output   2000 ml  Net   -680 ml    Inpatient Medications:  . amLODipine  10 mg Oral Daily  .  aspirin  81 mg Oral Daily  . atorvastatin  40 mg Oral q1800  . carvedilol  25 mg Oral BID WC  . docusate sodium  100 mg Oral BID  . enoxaparin (LOVENOX) injection  40 mg Subcutaneous Q24H  . furosemide  80 mg Oral BID  . insulin aspart  0-9 Units Subcutaneous TID WC  . lisinopril  40 mg Oral Daily  . metFORMIN  500 mg Oral BID WC  . potassium chloride  10 mEq Oral BID  . sodium chloride flush  3 mL Intravenous Q12H  . spironolactone  12.5 mg Oral Daily   Infusions:    Labs:  Recent Labs  12/07/15 0607 12/09/15 0857  NA 136 135  K 3.7 3.8  CL 100* 99*  CO2 27 27  GLUCOSE 167* 296*  BUN 25* 32*  CREATININE 1.20 1.39*  CALCIUM 9.2 9.3   No results for input(s): AST, ALT, ALKPHOS, BILITOT, PROT, ALBUMIN in the last 72 hours.  Recent Labs  12/07/15 0607  WBC 7.0  HGB 13.7  HCT 39.0*  MCV 84.7  PLT 148*    Recent Labs  12/06/15 1121 12/06/15 1534  TROPONINI <0.03 <0.03   Invalid input(s): POCBNP No results for input(s): HGBA1C in the last 72 hours.   Weights: Filed Weights   12/07/15 0514 12/08/15 0452 12/09/15 0456  Weight: 290 lb 12.8 oz (131.906 kg) 294 lb 11.2 oz (133.675 kg) 308 lb 4.8 oz (139.844 kg)     Radiology/Studies:  Dg Chest 2 View  12/07/2015  CLINICAL DATA:  One week of shortness of breath; history of CHF, diabetes, coronary artery disease with stent placement, morbid obesity. EXAM: CHEST  2 VIEW  COMPARISON:  Chest x-ray of December 06, 2015 FINDINGS: The lungs are well-expanded. There is no focal infiltrate. There is no pleural effusion. There is stable pleural thickening along the right lateral thoracic wall. The cardiac silhouette is top-normal in size. The pulmonary vascularity is normal. The mediastinum is normal in width. The bony thorax exhibits no acute abnormalities. IMPRESSION: Stable chest.  No acute cardiopulmonary abnormality. Electronically Signed   By: David  Swazil[place]Jordan M.D.   On: 12/07/2015 07:42   Dg Chest 2 View  12/06/2015  CLINICAL  DATA:  Chest pain and shortness of breath EXAM: CHEST  2 VIEW COMPARISON:  05/27/2012 FINDINGS: Stable borderline cardiomegaly. Negative aortic and hilar contours. Right diaphragm eventration. There is no edema, consolidation, effusion, or pneumothorax. Diffuse spondylosis. Chronic smooth right pleural thickening. IMPRESSION: No active cardiopulmonary disease. Electronically Signed   By: Marnee SpringJonathon  Watts M.D.   On: 12/06/2015 09:06     Assessment and Plan  Principal Problem:   Acute on chronic combined systolic and diastolic congestive heart failure, NYHA class 3 (HCC) Active Problems:   Chronic combined systolic and diastolic CHF, NYHA class 3 (HCC)   Essential hypertension   Orthopnea   Chest pain   Dyspnea on exertion   Coronary artery disease involving native coronary artery of native heart   Uncontrolled type 2 diabetes mellitus with circulatory disorder, without long-term current use of insulin (HCC)   Pain in the chest   Cardiomyopathy, ischemic    1. Acute on chronic combined systolic and diastolic CHF: -Improving, renal function starting to increase c/w decreased volume overload -Continue PO Lasix 80 mg bid with KCl repletion  -Minus 6.1 L for the admission on IV Lasix -Continue beta blocker and ACEi -Unable to afford Entresto, perhaps the reps could help with this as an outpatient -Continue spironolactone, will need recheck bmet first of the following week -No plans for inpatient cardiac cath as his reduced EF is known. Defer to primary cardiologist for possible outpatient workup -CHF education   2. CAD s/p PCI to LAD in 2011: -Presented with chest tightness, improved -Felt to be 2/2 volume overload -Troponin negative x 3 -Continue Coreg and aspirin   3. Remaining active problems in the problem list addressed by primary service   Signed, Eula Listenyan Dunn, PA-C [facility] Pager: [phone] 12/09/2015, 9:41 AM

## 2015-12-09 NOTE — Care Management (Signed)
For home discharge today and there are no needs.  [facility].  An appointment has been made with the heart failure clinic.

## 2015-12-09 NOTE — Progress Notes (Signed)
Received report from ongoing nurse, patient being discharge as per MD order, discharge instruction provided, iv removed tele removed. Prescription given to patient as per order

## 2015-12-09 NOTE — Discharge Summary (Signed)
[facility] - [place] at [facility]   PATIENT NAME: [name]    MR#:  [id number]  DATE OF BIRTH:  December 08, 1945  DATE OF ADMISSION:  12/06/2015 ADMITTING PHYSICIAN: Altamese Dilling, MD  DATE OF DISCHARGE: 12/09/2015  PRIMARY CARE PHYSICIAN: Thornton Papas, PA-C    ADMISSION DIAGNOSIS:  Orthopnea [R06.01] Dyspnea on exertion [R06.09] Acute on chronic systolic CHF (congestive heart failure) (HCC) [I50.23] Chest pain, unspecified chest pain type [R07.9]  DISCHARGE DIAGNOSIS:  Principal Problem:   Acute on chronic combined systolic and diastolic congestive heart failure, NYHA class 3 (HCC) Active Problems:   Chronic combined systolic and diastolic CHF, NYHA class 3 (HCC)   Essential hypertension   Orthopnea   Chest pain   Dyspnea on exertion   Coronary artery disease involving native coronary artery of native heart   Uncontrolled type 2 diabetes mellitus with circulatory disorder, without long-term current use of insulin (HCC)   Pain in the chest   Cardiomyopathy, ischemic   SECONDARY DIAGNOSIS:   Past Medical History  Diagnosis Date  . Type II diabetes mellitus (HCC)   . Hypertensive heart disease   . Hyperlipidemia   . Cerebrovascular accident [facility])     a. No residual weakness  . Chronic systolic CHF (congestive heart failure) (HCC)     a. 05/2012 Echo: EF 15%, impaired LV relaxation. Mod concentric LVH. mod dil LA. Severe global HK.  . Ischemic cardiomyopathy     a. 05/2012 Echo: EF 15%.  Marland Kitchen CAD (coronary artery disease)     a. 11/2009 Cath: LAD 90 (3.0x12 Xience DES);  b. 09/2011 LAD stent ok, 60d (nl FFR-->Med Rx).  . Morbid obesity (HCC)   . Dizziness     a. 01/2015 MRI: chronic left basal ganglia infarct, abnormal appearance of basialar and distal vertebral arteries suggestive of flow limiting stenoses, CTA recommended.    HOSPITAL COURSE:   * Chest pain and orthopnea Ac on Ch systolic CHF- EF 40% previous records and in  December 2013 his ejection fraction was 15%.  He claims taking all the medications regularly on time. Last time visit with cardiologist was almost 1 year ago.  He was non compliant with fluid restrictions.  I will keep him on cardiac monitoring and follow serial troponin, checked his echocardiogram  on fluid restriction and give him IV Lasix.  Cardiology consult with his primary cardiologist appreciated.  Have good diuresis.  Switch to oral lasix. Monitor for diuresis.    Had total > 7 ltr diuresis, councelled about fluid restriction.    Set up hid appointment with CHF clinic.    Advised to take metolazone as needed for increase in weight.  * Diabetes  Continue medication, keep on sliding scale coverage.  * Hypertension  Continue home medication.  Added spironolactone as EF < 30%  * Coronary artery disease  Continue home meds.  * hyperlipidemia  Atorvastatin.  DISCHARGE CONDITIONS:   Stable.  CONSULTS OBTAINED:  Treatment Team:  Antonieta Iba, MD  DRUG ALLERGIES:  No Known Allergies  DISCHARGE MEDICATIONS:   Current Discharge Medication List    START taking these medications   Details  atorvastatin (LIPITOR) 40 MG tablet Take 1 tablet (40 mg total) by mouth daily at 6 PM. Qty: 30 tablet, Refills: 0    metolazone (ZAROXOLYN) 2.5 MG tablet Take 1 tablet (2.5 mg total) by mouth daily as needed (Take if 3 Lb or higher weight gain noted in one day or 6 Lb weight  gain in 3-4 days over baseline weight.). Qty: 30 tablet, Refills: 0    spironolactone (ALDACTONE) 25 MG tablet Take 0.5 tablets (12.5 mg total) by mouth daily. Qty: 30 tablet, Refills: 0      CONTINUE these medications which have NOT CHANGED   Details  amLODipine (NORVASC) 10 MG tablet Take 1 tablet (10 mg total) by mouth daily. Qty: 90 tablet, Refills: 3    aspirin 81 MG tablet Take 81 mg by mouth daily.    carvedilol (COREG) 25 MG tablet Take 1 tablet (25 mg total) by mouth 2 (two)  times daily with a meal. Qty: 90 tablet, Refills: 3    furosemide (LASIX) 80 MG tablet Take 80 mg by mouth 2 (two) times daily.    lisinopril (PRINIVIL,ZESTRIL) 40 MG tablet Take 40 mg by mouth daily.    metFORMIN (GLUCOPHAGE) 500 MG tablet Take 1 tablet (500 mg total) by mouth 2 (two) times daily with a meal. Qty: 180 tablet, Refills: 3    potassium chloride (K-DUR) 10 MEQ tablet Take 1 tablet (10 mEq total) by mouth 2 (two) times daily as needed. Qty: 60 tablet, Refills: 6      STOP taking these medications     pravastatin (PRAVACHOL) 40 MG tablet          DISCHARGE INSTRUCTIONS:    Follow with Cardiologist in 2 week.  If you experience worsening of your admission symptoms, develop shortness of breath, life threatening emergency, suicidal or homicidal thoughts you must seek medical attention immediately by calling 911 or calling your MD immediately  if symptoms less severe.  You Must read complete instructions/literature along with all the possible adverse reactions/side effects for all the Medicines you take and that have been prescribed to you. Take any new Medicines after you have completely understood and accept all the possible adverse reactions/side effects.   Please note  You were cared for by a hospitalist during your hospital stay. If you have any questions about your discharge medications or the care you received while you were in the hospital after you are discharged, you can call the unit and asked to speak with the hospitalist on call if the hospitalist that took care of you is not available. Once you are discharged, your primary care physician will handle any further medical issues. Please note that NO REFILLS for any discharge medications will be authorized once you are discharged, as it is imperative that you return to your primary care physician (or establish a relationship with a primary care physician if you do not have one) for your aftercare needs so that they  can reassess your need for medications and monitor your lab values.    Today   CHIEF COMPLAINT:   Chief Complaint  Patient presents with  . Chest Pain  . Shortness of Breath  . Hypertension    HISTORY OF PRESENT ILLNESS:  Ardeen FillersGary Cottman  is a 70 y.o. male with a known history of Diabetes, hypertension, hyperlipidemia, cerebrovascular accident, CHF- ejection fraction 15% as per the echocardiogram in December 2013, coronary artery disease status post stent. He takes all his medications regularly, last time he had seen a cardiologist Dr.Gollan Was 8-9 months ago. For last 2-3 weeks he has worsening shortness of breath with minimal exertion off just walking a few steps and also he feels very short of breath while he is to lie down at the nighttime, but feels comfortable if he sits upright in his bed. He denies any leg swellings.  A few days ago at home when he checked his blood pressure it was 18 0/110, he tried calling his cardiologist office but did not get any reply. Today morning he had tightness in his chest which is central and radiating to both arms and going to his neck also and so concerned with this he came to emergency room. Because of his strong positive history and suspicion of having worsening of his cardiac status he is given as admission.   VITAL SIGNS:  Blood pressure 104/65, pulse 64, temperature 98.3 F (36.8 C), temperature source Oral, resp. rate 16, height 6\' 5"  (1.956 m), weight 139.844 kg (308 lb 4.8 oz), SpO2 95 %.  I/O:   Intake/Output Summary (Last 24 hours) at 12/09/15 1158 Last data filed at 12/09/15 1003  Gross per 24 hour  Intake   1323 ml  Output   2300 ml  Net   -977 ml    PHYSICAL EXAMINATION:   GENERAL: 70 y.o.-year-old patient lying in the bed with no acute distress.  EYES: Pupils equal, round, reactive to light and accommodation. No scleral icterus. Extraocular muscles intact.  HEENT: Head atraumatic, normocephalic. Oropharynx and nasopharynx  clear.  NECK: Supple, no jugular venous distention. No thyroid enlargement, no tenderness.  LUNGS: Normal breath sounds bilaterally, no wheezing, rales,rhonchi or crepitation. No use of accessory muscles of respiration.  CARDIOVASCULAR: S1, S2 normal. No murmurs, rubs, or gallops.  ABDOMEN: Soft, nontender, nondistended. Bowel sounds present. No organomegaly or mass.  EXTREMITIES: No pedal edema, cyanosis, or clubbing.  NEUROLOGIC: Cranial nerves II through XII are intact. Muscle strength 5/5 in all extremities. Sensation intact. Gait not checked.  PSYCHIATRIC: The patient is alert and oriented x 3.  SKIN: No obvious rash, lesion, or ulcer.   DATA REVIEW:   CBC  Recent Labs Lab 12/07/15 0607  WBC 7.0  HGB 13.7  HCT 39.0*  PLT 148*    Chemistries   Recent Labs Lab 12/09/15 0857  NA 135  K 3.8  CL 99*  CO2 27  GLUCOSE 296*  BUN 32*  CREATININE 1.39*  CALCIUM 9.3    Cardiac Enzymes  Recent Labs Lab 12/06/15 1534  TROPONINI <0.03    Microbiology Results  No results found for this or any previous visit.  RADIOLOGY:  No results found.  EKG:   Orders placed or performed during the hospital encounter of 12/06/15  . ED EKG within 10 minutes  . ED EKG within 10 minutes      Management plans discussed with the patient, family and they are in agreement.  CODE STATUS:     Code Status Orders        Start     Ordered   12/06/15 1121  Full code   Continuous     12/06/15 1121    Code Status History    Date Active Date Inactive Code Status Order ID Comments User Context   This patient has a current code status but no historical code status.      TOTAL TIME TAKING CARE OF THIS PATIENT: 35 minutes.    Altamese DillingVACHHANI, Olney Monier M.D on 12/09/2015 at 11:58 AM  Between 7am to 6pm - Pager - [phone]  After 6pm go to [email or web address] - password [email or web address] [facility]  [facility] [place] [facility]  Office  [phone]  CC: Primary care physician; Thornton PapasSykes,  Larry Justain, PA-C   Note: This dictation was prepared with Dragon dictation along with smaller phrase technology. Any transcriptional errors that result from this process are unintentional.

## 2015-12-09 NOTE — Telephone Encounter (Signed)
[facility].  Patient understands to follow up with Eula Listenyan Dunn, PA on 01/10/16 at 2:00 at [facility]. Patient understands discharge instructions? yes Patient understands medications and regiment? yes Patient understands to bring all medications to this visit? yes

## 2015-12-20 ENCOUNTER — Ambulatory Visit: Payer: Medicare Other | Admitting: Family

## 2015-12-20 ENCOUNTER — Telehealth: Payer: Self-pay | Admitting: Family

## 2015-12-20 NOTE — Telephone Encounter (Signed)
Patient missed his initial appointment at the Heart Failure Clinic on 12/20/15. Will attempt to reschedule.

## 2015-12-28 ENCOUNTER — Other Ambulatory Visit
Admission: RE | Admit: 2015-12-28 | Discharge: 2015-12-28 | Disposition: A | Discharge status: Home / Self Care | Payer: Medicare Other | Source: Ambulatory Visit | Attending: Physician Assistant | Admitting: Physician Assistant

## 2015-12-28 ENCOUNTER — Ambulatory Visit: Payer: Medicare Other | Attending: Family | Admitting: Family

## 2015-12-28 ENCOUNTER — Encounter: Payer: Self-pay | Admitting: Physician Assistant

## 2015-12-28 ENCOUNTER — Ambulatory Visit (INDEPENDENT_AMBULATORY_CARE_PROVIDER_SITE_OTHER): Payer: Medicare Other | Admitting: Physician Assistant

## 2015-12-28 ENCOUNTER — Encounter: Payer: Self-pay | Admitting: Family

## 2015-12-28 VITALS — BP 94/52 | HR 91 | Ht 77.0 in | Wt 309.4 lb

## 2015-12-28 VITALS — BP 115/65 | HR 80 | Resp 18 | Ht 77.0 in | Wt 308.0 lb

## 2015-12-28 DIAGNOSIS — I110 Hypertensive heart disease with heart failure: Secondary | ICD-10-CM

## 2015-12-28 DIAGNOSIS — I255 Ischemic cardiomyopathy: Secondary | ICD-10-CM

## 2015-12-28 DIAGNOSIS — Z96642 Presence of left artificial hip joint: Secondary | ICD-10-CM | POA: Insufficient documentation

## 2015-12-28 DIAGNOSIS — Z8673 Personal history of transient ischemic attack (TIA), and cerebral infarction without residual deficits: Secondary | ICD-10-CM | POA: Insufficient documentation

## 2015-12-28 DIAGNOSIS — M25511 Pain in right shoulder: Secondary | ICD-10-CM | POA: Diagnosis not present

## 2015-12-28 DIAGNOSIS — I2510 Atherosclerotic heart disease of native coronary artery without angina pectoris: Secondary | ICD-10-CM | POA: Insufficient documentation

## 2015-12-28 DIAGNOSIS — I5022 Chronic systolic (congestive) heart failure: Secondary | ICD-10-CM

## 2015-12-28 DIAGNOSIS — I5042 Chronic combined systolic (congestive) and diastolic (congestive) heart failure: Secondary | ICD-10-CM | POA: Insufficient documentation

## 2015-12-28 DIAGNOSIS — E785 Hyperlipidemia, unspecified: Secondary | ICD-10-CM

## 2015-12-28 DIAGNOSIS — Z91119 Patient's noncompliance with dietary regimen due to unspecified reason: Secondary | ICD-10-CM

## 2015-12-28 DIAGNOSIS — Z8249 Family history of ischemic heart disease and other diseases of the circulatory system: Secondary | ICD-10-CM | POA: Insufficient documentation

## 2015-12-28 DIAGNOSIS — Z01812 Encounter for preprocedural laboratory examination: Secondary | ICD-10-CM | POA: Insufficient documentation

## 2015-12-28 DIAGNOSIS — R42 Dizziness and giddiness: Secondary | ICD-10-CM | POA: Insufficient documentation

## 2015-12-28 DIAGNOSIS — G479 Sleep disorder, unspecified: Secondary | ICD-10-CM | POA: Diagnosis not present

## 2015-12-28 DIAGNOSIS — R0602 Shortness of breath: Secondary | ICD-10-CM | POA: Diagnosis not present

## 2015-12-28 DIAGNOSIS — Z7984 Long term (current) use of oral hypoglycemic drugs: Secondary | ICD-10-CM | POA: Insufficient documentation

## 2015-12-28 DIAGNOSIS — Z955 Presence of coronary angioplasty implant and graft: Secondary | ICD-10-CM | POA: Diagnosis not present

## 2015-12-28 DIAGNOSIS — Z6836 Body mass index (BMI) 36.0-36.9, adult: Secondary | ICD-10-CM | POA: Diagnosis not present

## 2015-12-28 DIAGNOSIS — F329 Major depressive disorder, single episode, unspecified: Secondary | ICD-10-CM | POA: Diagnosis not present

## 2015-12-28 DIAGNOSIS — Z9111 Patient's noncompliance with dietary regimen: Secondary | ICD-10-CM | POA: Diagnosis not present

## 2015-12-28 DIAGNOSIS — R5382 Chronic fatigue, unspecified: Secondary | ICD-10-CM

## 2015-12-28 DIAGNOSIS — I10 Essential (primary) hypertension: Secondary | ICD-10-CM

## 2015-12-28 DIAGNOSIS — E669 Obesity, unspecified: Secondary | ICD-10-CM

## 2015-12-28 DIAGNOSIS — Z7982 Long term (current) use of aspirin: Secondary | ICD-10-CM | POA: Diagnosis not present

## 2015-12-28 DIAGNOSIS — R5383 Other fatigue: Secondary | ICD-10-CM | POA: Insufficient documentation

## 2015-12-28 DIAGNOSIS — E119 Type 2 diabetes mellitus without complications: Secondary | ICD-10-CM | POA: Insufficient documentation

## 2015-12-28 DIAGNOSIS — F32A Depression, unspecified: Secondary | ICD-10-CM

## 2015-12-28 LAB — BASIC METABOLIC PANEL
ANION GAP: 8 (ref 5–15)
BUN: 21 mg/dL — AB (ref 6–20)
CALCIUM: 9.2 mg/dL (ref 8.9–10.3)
CO2: 28 mmol/L (ref 22–32)
Chloride: 101 mmol/L (ref 101–111)
Creatinine, Ser: 1.71 mg/dL — ABNORMAL HIGH (ref 0.61–1.24)
GFR calc Af Amer: 46 mL/min — ABNORMAL LOW (ref >60)
GFR, EST NON AFRICAN AMERICAN: 39 mL/min — AB (ref >60)
GLUCOSE: 182 mg/dL — AB (ref 65–99)
Potassium: 3.6 mmol/L (ref 3.5–5.1)
Sodium: 137 mmol/L (ref 135–145)

## 2015-12-28 LAB — CBC
HCT: 36.3 % — ABNORMAL LOW (ref 40.0–52.0)
Hemoglobin: 12.6 g/dL — ABNORMAL LOW (ref 13.0–18.0)
MCH: 29.5 pg (ref 26.0–34.0)
MCHC: 34.7 g/dL (ref 32.0–36.0)
MCV: 85 fL (ref 80.0–100.0)
PLATELETS: 185 10*3/uL (ref 150–440)
RBC: 4.27 MIL/uL — ABNORMAL LOW (ref 4.40–5.90)
RDW: 13.4 % (ref 11.5–14.5)
WBC: 8.3 10*3/uL (ref 3.8–10.6)

## 2015-12-28 LAB — PROTIME-INR
INR: 1.11
PROTHROMBIN TIME: 14.5 s (ref 11.4–15.0)

## 2015-12-28 NOTE — Progress Notes (Signed)
Cardiology Office Note Date:  12/28/2015  Patient ID:  [name] 03/27/1946, MRN [id number] PCP:  Thornton Papas, PA-C  Cardiologist:  Dr. Mariah Milling, MD    Chief Complaint: Hospital follow up  History of Present Illness: [name] is a 70 y.o. male with history of CAD s/p PCI/DES to the LAD in 2011, chronic systolic CHF/ICM (previously on Entresto though unable to afford, now back on lisinopril), dietary noncompliance, hypertensive heart disease, HLD, morbid obesity, stroke, poorly controlled DM2, remote ETOH abuse, and dizziness who presents for hospital follow up from recent admission to [facility] from 6/26-6/29 for acute on chronic systolic CHF/ICM and unstable angina.   Patient last underwent ischemic evaluation in 2013 with cardiac cath at that time showing patent LAD stent with moderate, nonobstructive more distal LAD disease with normal FFR. EF was noted to be 15% at that time. He was medically managed since. He was last seen in clinic in 02/2016. At that time, he was noting significant dizziness. Prior MRI suggested vertebrobailar disease and CTA was ordered to further evaluate. Unfortunately, the patient never followed up for imaging. Since 02/2016 his dizziness had improved. He has a very exertional job, working as a Marine scientist. He had noticed some slowing down around 05/2015 though was in his USOH until mid June with he noticed increased abdominal girth, DOE, weight gain, and orthopnea. He also noted rest and exertional chest pain. Because of these symptoms he presented to [facility] on 6/26. At that time his weight was noted to be 316 pounds (baseline around 300 pounds). Labs upon his arrival to [facility] showed a BNP of 97, CXR without acute findings, troponin normal, EKG with laterL TWI (old). He underwent successful IV diuresis with Lasix. Echo on 6/26 showed an EF of 20-25%, diffuse HK, GR1DD, moderately dilated LA, RV systolic function was normal, PASP could not be estimated. It  was initially recommended he undergo cardiac cath, though in follow up rounds this was put on hold by his primary cardiologist. Spironolactone was added prior to discharge.   Since his discharge, he saw CHF Clinic this morning and noted persistent SOB and fatigue. He was scheduled for a sleep study as he has never had one. He tells me his weight has been down trending at home with reading of 306 lbs on 7/16, 304 lbs on 7/17, and 302 lbs today at home. His weight at the CHF clinic was 308 lbs and 309 in our office. He has not needed to take and prn metolazone. He is taking his medications as directed including Lasix 80 mg bid. He has significantly cut back on his PO fluid consumption and no longer uses salt. He denies any increased orthopnea and no LE edema. He has not had any chest pain. He states if he could breathe better he would be happy.    Past Medical History  Diagnosis Date  . Type II diabetes mellitus (HCC)   . Hypertensive heart disease   . Hyperlipidemia   . Cerebrovascular accident [facility])     a. No residual weakness  . Chronic systolic CHF (congestive heart failure) (HCC)     a. 05/2012 Echo: EF 15%, impaired LV relaxation. Mod concentric LVH. mod dil LA. Severe global HK.  . Ischemic cardiomyopathy     a. 05/2012 Echo: EF 15%.  Marland Kitchen CAD (coronary artery disease)     a. 11/2009 Cath: LAD 90 (3.0x12 Xience DES);  b. 09/2011 LAD stent ok, 60d (nl FFR-->Med Rx).  Marland Kitchen  Morbid obesity (HCC)   . Dizziness     a. 01/2015 MRI: chronic left basal ganglia infarct, abnormal appearance of basialar and distal vertebral arteries suggestive of flow limiting stenoses, CTA recommended.    Past Surgical History  Procedure Laterality Date  . Knee surgery      left  . Knee cartilage surgery      right  . Cardiac catheterization  2013    showing 60% LAD disease after stent, no stent placed based on FFR pressure wire results  . Cardiac catheterization  2011    showing 90% LAD stenosis s/p stent placement   . Total hip arthroplasty Left 2014  . Lasik Bilateral 2015    eye sugeries  . Coronary angioplasty with stent placement      Current Outpatient Prescriptions  Medication Sig Dispense Refill  . amLODipine (NORVASC) 10 MG tablet Take 1 tablet (10 mg total) by mouth daily. 90 tablet 3  . aspirin 81 MG tablet Take 81 mg by mouth daily.    . carvedilol (COREG) 25 MG tablet Take 1 tablet (25 mg total) by mouth 2 (two) times daily with a meal. 90 tablet 3  . furosemide (LASIX) 80 MG tablet Take 80 mg by mouth 2 (two) times daily.    Marland Kitchen. lisinopril (PRINIVIL,ZESTRIL) 40 MG tablet Take 40 mg by mouth daily.    . metFORMIN (GLUCOPHAGE) 500 MG tablet Take 1 tablet (500 mg total) by mouth 2 (two) times daily with a meal. 180 tablet 3  . potassium chloride (K-DUR) 10 MEQ tablet Take 1 tablet (10 mEq total) by mouth 2 (two) times daily as needed. 60 tablet 6  . pravastatin (PRAVACHOL) 40 MG tablet Take 40 mg by mouth daily.     No current facility-administered medications for this visit.    Allergies:   Review of patient's allergies indicates no known allergies.   Social History:  The patient  reports that he has never smoked. He has never used smokeless tobacco. He reports that he does not drink alcohol or use illicit drugs.   Family History:  The patient's family history includes CAD in his brother, father, and sister.  ROS:   Review of Systems  Constitutional: Positive for weight loss and malaise/fatigue. Negative for fever, chills and diaphoresis.  HENT: Negative for congestion.   Eyes: Negative for discharge and redness.  Respiratory: Positive for shortness of breath. Negative for cough, hemoptysis, sputum production and wheezing.   Cardiovascular: Negative for chest pain, palpitations, orthopnea, claudication, leg swelling and PND.  Gastrointestinal: Negative for nausea, vomiting and abdominal pain.  Musculoskeletal: Positive for neck pain. Negative for myalgias and falls.  Skin: Negative  for rash.  Neurological: Positive for weakness. Negative for dizziness, sensory change, speech change, focal weakness and loss of consciousness.  Endo/Heme/Allergies: Does not bruise/bleed easily.  Psychiatric/Behavioral: Negative for substance abuse. The patient is not nervous/anxious.   All other systems reviewed and are negative.    PHYSICAL EXAM:  VS:  BP 94/52 mmHg  Pulse 91  Ht 6\' 5"  (1.956 m)  Wt 309 lb 6.4 oz (140.343 kg)  BMI 36.68 kg/m2  SpO2 94% BMI: Body mass index is 36.68 kg/(m^2).  Physical Exam  Constitutional: He is oriented to person, place, and time. He appears well-developed and well-nourished.  HENT:  Head: Normocephalic and atraumatic.  Eyes: Right eye exhibits no discharge. Left eye exhibits no discharge.  Neck: Normal range of motion. No JVD present.  Cardiovascular: Normal rate, regular rhythm, S1 normal,  S2 normal and normal heart sounds.  Exam reveals no distant heart sounds, no friction rub, no midsystolic click and no opening snap.   No murmur heard. Pulmonary/Chest: Effort normal and breath sounds normal. No respiratory distress. He has no decreased breath sounds. He has no wheezes. He has no rales. He exhibits no tenderness.  Abdominal: Soft. He exhibits no distension. There is no tenderness.  Musculoskeletal: He exhibits no edema.  Neurological: He is alert and oriented to person, place, and time.  Skin: Skin is warm and dry. No cyanosis. Nails show no clubbing.  Psychiatric: He has a normal mood and affect. His speech is normal and behavior is normal. Judgment and thought content normal.     EKG:  Was not ordered today.   Recent Labs: 12/06/2015: B Natriuretic Peptide 97.0 12/07/2015: Hemoglobin 13.7; Platelets 148* 12/09/2015: BUN 32*; Creatinine, Ser 1.39*; Potassium 3.8; Sodium 135  02/22/2015: Chol/HDL Ratio 8.1*; Cholesterol, Total 251*; HDL 31*; LDL Calculated 174*; Triglycerides 231*   Estimated Creatinine Clearance: 78.8 mL/min (by C-G  formula based on Cr of 1.39).   Wt Readings from Last 3 Encounters:  12/28/15 309 lb 6.4 oz (140.343 kg)  12/28/15 308 lb (139.708 kg)  12/09/15 308 lb 4.8 oz (139.844 kg)     Other studies reviewed: Additional studies/records reviewed today include: summarized above  ASSESSMENT AND PLAN:  1. Chronic systolic CHF/ICM/SOB: He does not appear to be volume overloaded at this time. He has been taking Lasix 80 mgm bid without the need for metolazone. Weight at home today was 302 (baseline weight approximately 300 pounds). He remains significantly SOB. Perhaps his SOB is multifactorial including cardiomyopathy with an EF of 25%, morbid obesity, and deconditioning, though at this time I am unable to rule out ischemia as a cause as well. Will schedule the patient for [facility] with Dr. Herbie Baltimore, MD on 7/19 given the above. He will hold metformin, Lasix, and lisinopril for pre-cath. Risks and benefits of cardiac catheterization have been discussed with the patient including risks of bleeding, bruising, infection, kidney damage, stroke, heart attack, and death. The patient understands these risks and is willing to proceed with the procedure. All questions have been answered and concerns listened to. Further management to be determined after cath results.   2. CAD as above: Scheduled for [facility] on 7/19 as above. Continue aspirin 81 mg and Coreg.   3. Hypertensive heart disease: BP is soft in our office at this time. Has been well controlled. Continue current medications for now unless we see his BP remaining soft.   4. HLD: On pravastatin.   5. Morbid obesity: Weight loss advised. He would benefit from cardiac rehab.   6. Fatigue/increased somnolence: He has been scheduled for a sleep study by the CHF clinic.   Disposition: F/u with myself or Dr. Mariah Milling, MD after cardiac cath   Current medicines are reviewed at length with the patient today.  The patient did not have any concerns regarding  medicines.  Elinor Dodge PA-C 12/28/2015 3:05 PM     [facility] [address] [place], [place] [address] [phone]

## 2015-12-28 NOTE — Progress Notes (Signed)
Subjective:    Patient ID: [name], male    DOB: 1945/06/20, 70 y.o.   MRN: [id number]  Congestive Heart Failure Presents for initial visit. The disease course has been stable. Associated symptoms include fatigue, palpitations ("at times") and shortness of breath. Pertinent negatives include no abdominal pain, chest pain, chest pressure, edema or orthopnea. The symptoms have been worsening. Past treatments include ACE inhibitors, aldosterone receptor blockers, beta blockers and salt and fluid restriction. The treatment provided mild relief. Compliance with prior treatments has been good. His past medical history is significant for CVA, DM and HTN. He has multiple 1st degree relatives with heart disease.  Hypertension This is a chronic problem. The current episode started more than 1 year ago. The problem is controlled. Associated symptoms include malaise/fatigue, palpitations ("at times") and shortness of breath. Pertinent negatives include no chest pain, headaches, neck pain, orthopnea, peripheral edema or PND. There are no associated agents to hypertension. Risk factors for coronary artery disease include diabetes mellitus, dyslipidemia, family history, male gender, obesity and stress. Past treatments include ACE inhibitors, beta blockers, calcium channel blockers, diuretics and lifestyle changes. The current treatment provides significant improvement. Compliance problems include exercise.  Hypertensive end-organ damage includes CAD/MI, CVA and heart failure.  Other This is a new (depression) problem. The current episode started more than 1 year ago. The problem occurs daily. The problem has been gradually worsening. Associated symptoms include arthralgias ("chronic right shoulder"), congestion and fatigue. Pertinent negatives include no abdominal pain, chest pain, coughing, headaches, nausea, neck pain, numbness, sore throat or weakness. The symptoms are aggravated by stress. He has tried rest  and relaxation for the symptoms. The treatment provided no relief.    Past Medical History  Diagnosis Date  . Type II diabetes mellitus (HCC)   . Hypertensive heart disease   . Hyperlipidemia   . Cerebrovascular accident [facility])     a. No residual weakness  . Chronic systolic CHF (congestive heart failure) (HCC)     a. 05/2012 Echo: EF 15%, impaired LV relaxation. Mod concentric LVH. mod dil LA. Severe global HK.  . Ischemic cardiomyopathy     a. 05/2012 Echo: EF 15%.  Marland Kitchen CAD (coronary artery disease)     a. 11/2009 Cath: LAD 90 (3.0x12 Xience DES);  b. 09/2011 LAD stent ok, 60d (nl FFR-->Med Rx).  . Morbid obesity (HCC)   . Dizziness     a. 01/2015 MRI: chronic left basal ganglia infarct, abnormal appearance of basialar and distal vertebral arteries suggestive of flow limiting stenoses, CTA recommended.    Past Surgical History  Procedure Laterality Date  . Knee surgery      left  . Knee cartilage surgery      right  . Cardiac catheterization  2013    showing 60% LAD disease after stent, no stent placed based on FFR pressure wire results  . Cardiac catheterization  2011    showing 90% LAD stenosis s/p stent placement  . Total hip arthroplasty Left 2014  . Lasik Bilateral 2015    eye sugeries  . Coronary angioplasty with stent placement      Family History  Problem Relation Age of Onset  . CAD Father     died @ 63 of MI - CAD dx in his 35's.  Marland Kitchen CAD Brother     several brothers died of MI  . CAD Sister     died of MI.    Social History  Substance Use Topics  .  Smoking status: Never Smoker   . Smokeless tobacco: Never Used  . Alcohol Use: No    No Known Allergies  Prior to Admission medications   Medication Sig Start Date End Date Taking? Authorizing Provider  amLODipine (NORVASC) 10 MG tablet Take 1 tablet (10 mg total) by mouth daily. 03/16/14  Yes Antonieta Ibaimothy J Gollan, MD  aspirin 81 MG tablet Take 81 mg by mouth daily.   Yes Historical Provider, MD  carvedilol  (COREG) 25 MG tablet Take 1 tablet (25 mg total) by mouth 2 (two) times daily with a meal. 03/16/14  Yes Antonieta Ibaimothy J Gollan, MD  furosemide (LASIX) 80 MG tablet Take 80 mg by mouth 2 (two) times daily.   Yes Historical Provider, MD  lisinopril (PRINIVIL,ZESTRIL) 40 MG tablet Take 40 mg by mouth daily.   Yes Historical Provider, MD  metFORMIN (GLUCOPHAGE) 500 MG tablet Take 1 tablet (500 mg total) by mouth 2 (two) times daily with a meal. 01/14/15  Yes Antonieta Ibaimothy J Gollan, MD  metolazone (ZAROXOLYN) 2.5 MG tablet Take 1 tablet (2.5 mg total) by mouth daily as needed (Take if 3 Lb or higher weight gain noted in one day or 6 Lb weight gain in 3-4 days over baseline weight.). 12/09/15  Yes Altamese DillingVaibhavkumar Vachhani, MD  potassium chloride (K-DUR) 10 MEQ tablet Take 1 tablet (10 mEq total) by mouth 2 (two) times daily as needed. 03/16/14  Yes Antonieta Ibaimothy J Gollan, MD  spironolactone (ALDACTONE) 25 MG tablet Take 0.5 tablets (12.5 mg total) by mouth daily. 12/09/15  Yes Altamese DillingVaibhavkumar Vachhani, MD  atorvastatin (LIPITOR) 40 MG tablet Take 1 tablet (40 mg total) by mouth daily at 6 PM. 12/09/15   Altamese DillingVaibhavkumar Vachhani, MD     Review of Systems  Constitutional: Positive for malaise/fatigue and fatigue. Negative for appetite change.  HENT: Positive for congestion. Negative for postnasal drip and sore throat.   Eyes: Negative.   Respiratory: Positive for shortness of breath. Negative for cough and chest tightness.   Cardiovascular: Positive for palpitations ("at times"). Negative for chest pain, orthopnea, leg swelling and PND.  Gastrointestinal: Negative for nausea, abdominal pain and abdominal distention.  Endocrine: Negative.   Genitourinary: Negative.   Musculoskeletal: Positive for arthralgias ("chronic right shoulder"). Negative for neck pain.  Skin: Negative.   Allergic/Immunologic: Negative.   Neurological: Positive for light-headedness. Negative for weakness, numbness and headaches.  Hematological: Negative for  adenopathy. Does not bruise/bleed easily.  Psychiatric/Behavioral: Positive for sleep disturbance (sleeping on 2 pillows: not waking rested) and dysphoric mood. The patient is not nervous/anxious.        Objective:   Physical Exam  Constitutional: He is oriented to person, place, and time. He appears well-developed and well-nourished.  HENT:  Head: Normocephalic and atraumatic.  Eyes: Conjunctivae are normal. Pupils are equal, round, and reactive to light.  Neck: Normal range of motion. Neck supple.  Cardiovascular: Normal rate and regular rhythm.   Pulmonary/Chest: Effort normal. He has no wheezes. He has no rales.  Abdominal: Soft. He exhibits no distension. There is no tenderness.  Musculoskeletal: He exhibits no edema or tenderness.  Neurological: He is alert and oriented to person, place, and time.  Skin: Skin is warm and dry.  Psychiatric: His behavior is normal. Thought content normal. His affect is not angry. He exhibits a depressed mood.  Nursing note and vitals reviewed.   BP 115/65 mmHg  Pulse 80  Resp 18  Ht 6\' 5"  (1.956 m)  Wt 308 lb (139.708 kg)  BMI  36.52 kg/m2  SpO2 99%       Assessment & Plan:  1: Chronic heart failure with reduced ejection fraction- Patient presents with chronic fatigue and shortness of breath. He says that he gets tired doing very little (Class III) but that he doesn't have any symptoms at rest. He denies any chest pain or swelling in his legs or abdomen. He is already weighing himself and has metolazone at home that he can take if needed for weight gain. Reminded to call for an overnight weight gain of >2 pounds or a weekly weight gain of >5 pounds. He is not adding any salt to his food but admits that he's not reading food labels and he tends to eat out more often than cooking at home. Says that it's difficult to cook for just 1 person but when he goes out to eat, he tries to pick low sodium foods. Discussed the importance of following a 2000mg   sodium diet and written dietary information was given to him. He says that he's already limiting his fluid intake to 2L daily but says that he gets thirsty during the day (he's unsure of what his glucose is). Discussed using sugar free candy to suck on or sugar free gum to chew. Says that he was on entresto once before but had to be taken off of it due to dizziness. Discussed possibly switching his lisinopril to that again but if it was done, may need to decrease his amlodipine or furosemide to allow for more blood pressure to work with.  2: HTN- Blood pressure looks good but is on the lower end.  3: Chronic fatigue- He says that he's not sleeping well and wakes up feeling just as tired as when he went to bed. Says that he feels tired all day long. Discussed ordering a sleep study to rule out sleep apnea and patient is agreeable to this. Faxed in referral form to sleep-med so they can contact him for the appointment. 4: Depression- Patient does endorse feeling depressed which could certainly be contributing to his fatigue. He says that his mom died about 3 years ago and he had been living with her for many years. Also recently had a brother die along with numerous friends of his. Denied suicidal thoughts. Discussed how untreated depression can affect his health but he's concerned that he would have to go the hospital for treatment. Informed him that his PCP could easily start an antidepressant, if needed, in the office and there wouldn't be any need for hospital admittance. Patient says that he'll address this with his PCP.   Medication list that patient brought was reviewed.  Return here in 1 month or sooner for any questions/problems before then.

## 2015-12-28 NOTE — Patient Instructions (Addendum)
Medication Instructions:  Your physician recommends that you continue on your current medications as directed. Please refer to the Current Medication list given to you today.   Labwork: BMET, CBC, PT/INR  Testing/Procedures: Your physician has requested that you have a cardiac catheterization. Cardiac catheterization is used to diagnose and/or treat various heart conditions. Doctors may recommend this procedure for a number of different reasons. The most common reason is to evaluate chest pain. Chest pain can be a symptom of coronary artery disease (CAD), and cardiac catheterization can show whether plaque is narrowing or blocking your heart's arteries. This procedure is also used to evaluate the valves, as well as measure the blood flow and oxygen levels in different parts of your heart. For further information please visit [email or web address] Please follow instruction sheet, as given.  [facility] Cardiac Cath Instructions   You are scheduled for a Cardiac Cath on: Wednesday, July 19  Please arrive at 8:30am on the day of your procedure  Please expect a call from our [facility] to pre-register you  Do not eat/drink anything after midnight  Someone will need to drive you home  It is recommended someone be with you for the first 24 hours after your procedure  Wear clothes that are easy to get on/off and wear slip on shoes if possible   Medications bring a current list of all medications with you   _xx__ Do not take these medications before your procedure: Lasix, lisinopril or metformin. Hold metformin 24 hours before and 48 hours after procedure.   Day of your procedure: Arrive at the [facility] entrance.  Free valet service is available.  After entering the [facility] please check-in at the registration desk (1st desk on your right) to receive your armband. After receiving your armband someone will escort you to the cardiac cath/special procedures waiting  area.  The usual length of stay after your procedure is about 2 to 3 hours.  This can vary.  If you have any questions, please call our office at [phone], or you may call the cardiac cath lab at [facility] directly at [phone]   Follow-Up: Your physician recommends that you schedule a follow-up appointment after cardiac cath.   Any Other Special Instructions Will Be Listed Below (If Applicable).     If you need a refill on your cardiac medications before your next appointment, please call your pharmacy.  Angiogram An angiogram, also called angiography, is a procedure used to look at the blood vessels. In this procedure, dye is injected through a long, thin tube (catheter) into an artery. X-rays are then taken. The X-rays will show if there is a blockage or problem in a blood vessel.  LET [facility] CARE PROVIDER KNOW ABOUT: 8. Any allergies you have, including allergies to shellfish or contrast dye.  9. All medicines you are taking, including vitamins, herbs, eye drops, creams, and over-the-counter medicines.  10. Previous problems you or members of your family have had with the use of anesthetics.  11. Any blood disorders you have.  12. Previous surgeries you have had. 13. Any previous kidney problems or failure you have had. 14. Medical conditions you have.  15. Possibility of pregnancy, if this applies. RISKS AND COMPLICATIONS Generally, an angiogram is a safe procedure. However, as with any procedure, problems can occur. Possible problems include:  Injury to the blood vessels, including rupture or bleeding.  Infection or bruising at the catheter site.  Allergic reaction to the dye or contrast used.  Kidney damage from the dye or contrast used.  Blood clots that can lead to a stroke or heart attack. BEFORE THE PROCEDURE  Do not eat or drink after midnight on the night before the procedure, or as directed by your health care provider.   Ask your health care  provider if you may drink enough water to take any needed medicines the morning of the procedure.  PROCEDURE  You may be given a medicine to help you relax (sedative) before and during the procedure. This medicine is given through an IV access tube that is inserted into one of your veins.   The area where the catheter will be inserted will be washed and shaved. This is usually done in the groin but may be done in the fold of your arm (near your elbow) or in the wrist.  A medicine will be given to numb the area where the catheter will be inserted (local anesthetic).  The catheter will be inserted with a guide wire into an artery. The catheter is guided by using a type of X-ray (fluoroscopy) to the blood vessel being examined.   Dye is then injected into the catheter, and X-rays are taken. The dye helps to show where any narrowing or blockages are located.  AFTER THE PROCEDURE   If the procedure is done through the leg, you will be kept in bed lying flat for several hours. You will be instructed to not bend or cross your legs.  The insertion site will be checked frequently.  The pulse in your feet or wrist will be checked frequently.  Additional blood tests, X-rays, and electrocardiography may be done.   You may need to stay in the hospital overnight for observation.    This information is not intended to replace advice given to you by your health care provider. Make sure you discuss any questions you have with your health care provider.   Document Released: 03/08/2005 Document Revised: 06/19/2014 Document Reviewed: 10/30/2012 Elsevier Interactive Patient Education 2016 Elsevier Inc. Angiogram, Care After Refer to this sheet in the next few weeks. These instructions provide you with information about caring for yourself after your procedure. Your health care provider may also give you more specific instructions. Your treatment has been planned according to current medical practices,  but problems sometimes occur. Call your health care provider if you have any problems or questions after your procedure. WHAT TO EXPECT AFTER THE PROCEDURE After your procedure, it is typical to have the following: 16. Bruising at the catheter insertion site that usually fades within 1-2 weeks. 17. Blood collecting in the tissue (hematoma) that may be painful to the touch. It should usually decrease in size and tenderness within 1-2 weeks. HOME CARE INSTRUCTIONS  Take medicines only as directed by your health care provider.  You may shower 24-48 hours after the procedure or as directed by your health care provider. Remove the bandage (dressing) and gently wash the site with plain soap and water. Pat the area dry with a clean towel. Do not rub the site, because this may cause bleeding.  Do not take baths, swim, or use a hot tub until your health care provider approves.  Check your insertion site every day for redness, swelling, or drainage.  Do not apply powder or lotion to the site.  Do not lift over 10 lb (4.5 kg) for 5 days after your procedure or as directed by your health care provider.  Ask your health care provider when it is  okay to:  Return to work or school.  Resume usual physical activities or sports.  Resume sexual activity.  Do not drive home if you are discharged the same day as the procedure. Have someone else drive you.  You may drive 24 hours after the procedure unless otherwise instructed by your health care provider.  Do not operate machinery or power tools for 24 hours after the procedure or as directed by your health care provider.  If your procedure was done as an outpatient procedure, which means that you went home the same day as your procedure, a responsible adult should be with you for the first 24 hours after you arrive home.  Keep all follow-up visits as directed by your health care provider. This is important. SEEK MEDICAL CARE IF:  You have a  fever.  You have chills.  You have increased bleeding from the catheter insertion site. Hold pressure on the site. SEEK IMMEDIATE MEDICAL CARE IF:  You have unusual pain at the catheter insertion site.  You have redness, warmth, or swelling at the catheter insertion site.  You have drainage (other than a small amount of blood on the dressing) from the catheter insertion site.  The catheter insertion site is bleeding, and the bleeding does not stop after 30 minutes of holding steady pressure on the site.  The area near or just beyond the catheter insertion site becomes pale, cool, tingly, or numb.   This information is not intended to replace advice given to you by your health care provider. Make sure you discuss any questions you have with your health care provider.   Document Released: 12/15/2004 Document Revised: 06/19/2014 Document Reviewed: 10/30/2012 Elsevier Interactive Patient Education [employer]2016 Elsevier Inc.

## 2015-12-29 ENCOUNTER — Encounter
Admission: RE | Disposition: A | Discharge status: Home / Self Care | Payer: Self-pay | Source: Ambulatory Visit | Attending: Cardiology

## 2015-12-29 ENCOUNTER — Telehealth: Payer: Self-pay | Admitting: Physician Assistant

## 2015-12-29 ENCOUNTER — Ambulatory Visit
Admission: RE | Admit: 2015-12-29 | Discharge: 2015-12-29 | Disposition: A | Discharge status: Home / Self Care | Payer: Medicare Other | Source: Ambulatory Visit | Attending: Cardiology | Admitting: Cardiology

## 2015-12-29 ENCOUNTER — Encounter: Payer: Self-pay | Admitting: Cardiology

## 2015-12-29 ENCOUNTER — Other Ambulatory Visit: Payer: Self-pay | Admitting: Physician Assistant

## 2015-12-29 ENCOUNTER — Other Ambulatory Visit: Payer: Self-pay

## 2015-12-29 DIAGNOSIS — Z6836 Body mass index (BMI) 36.0-36.9, adult: Secondary | ICD-10-CM | POA: Insufficient documentation

## 2015-12-29 DIAGNOSIS — I25118 Atherosclerotic heart disease of native coronary artery with other forms of angina pectoris: Secondary | ICD-10-CM | POA: Insufficient documentation

## 2015-12-29 DIAGNOSIS — Z96642 Presence of left artificial hip joint: Secondary | ICD-10-CM | POA: Insufficient documentation

## 2015-12-29 DIAGNOSIS — N179 Acute kidney failure, unspecified: Secondary | ICD-10-CM

## 2015-12-29 DIAGNOSIS — Z8673 Personal history of transient ischemic attack (TIA), and cerebral infarction without residual deficits: Secondary | ICD-10-CM | POA: Diagnosis not present

## 2015-12-29 DIAGNOSIS — Z7984 Long term (current) use of oral hypoglycemic drugs: Secondary | ICD-10-CM | POA: Insufficient documentation

## 2015-12-29 DIAGNOSIS — E119 Type 2 diabetes mellitus without complications: Secondary | ICD-10-CM | POA: Diagnosis not present

## 2015-12-29 DIAGNOSIS — I5042 Chronic combined systolic (congestive) and diastolic (congestive) heart failure: Secondary | ICD-10-CM | POA: Diagnosis present

## 2015-12-29 DIAGNOSIS — E785 Hyperlipidemia, unspecified: Secondary | ICD-10-CM | POA: Insufficient documentation

## 2015-12-29 DIAGNOSIS — Z79899 Other long term (current) drug therapy: Secondary | ICD-10-CM | POA: Diagnosis not present

## 2015-12-29 DIAGNOSIS — I255 Ischemic cardiomyopathy: Secondary | ICD-10-CM | POA: Diagnosis not present

## 2015-12-29 DIAGNOSIS — R400 Somnolence: Secondary | ICD-10-CM | POA: Diagnosis not present

## 2015-12-29 DIAGNOSIS — I110 Hypertensive heart disease with heart failure: Secondary | ICD-10-CM | POA: Insufficient documentation

## 2015-12-29 DIAGNOSIS — Z7982 Long term (current) use of aspirin: Secondary | ICD-10-CM | POA: Diagnosis not present

## 2015-12-29 DIAGNOSIS — Z09 Encounter for follow-up examination after completed treatment for conditions other than malignant neoplasm: Secondary | ICD-10-CM | POA: Insufficient documentation

## 2015-12-29 DIAGNOSIS — R5383 Other fatigue: Secondary | ICD-10-CM | POA: Diagnosis not present

## 2015-12-29 DIAGNOSIS — Z955 Presence of coronary angioplasty implant and graft: Secondary | ICD-10-CM | POA: Insufficient documentation

## 2015-12-29 DIAGNOSIS — Z951 Presence of aortocoronary bypass graft: Secondary | ICD-10-CM | POA: Insufficient documentation

## 2015-12-29 DIAGNOSIS — I5022 Chronic systolic (congestive) heart failure: Secondary | ICD-10-CM

## 2015-12-29 DIAGNOSIS — I2510 Atherosclerotic heart disease of native coronary artery without angina pectoris: Secondary | ICD-10-CM | POA: Diagnosis present

## 2015-12-29 HISTORY — PX: CARDIAC CATHETERIZATION: SHX172

## 2015-12-29 SURGERY — RIGHT AND LEFT HEART CATH
Anesthesia: Moderate Sedation

## 2015-12-29 SURGERY — RIGHT/LEFT HEART CATH AND CORONARY ANGIOGRAPHY
Anesthesia: Moderate Sedation | Laterality: Bilateral

## 2015-12-29 MED ORDER — CLOPIDOGREL BISULFATE 75 MG PO TABS: 75.0000 mg | ORAL_TABLET | Freq: Every day | ORAL | Status: DC

## 2015-12-29 MED ORDER — VERAPAMIL HCL 2.5 MG/ML IV SOLN
INTRAVENOUS | Status: DC | PRN
  Administered 2015-12-29: 2.5 mg via INTRA_ARTERIAL

## 2015-12-29 MED ORDER — VERAPAMIL HCL 2.5 MG/ML IV SOLN
INTRAVENOUS | Status: AC
  Filled 2015-12-29: qty 2

## 2015-12-29 MED ORDER — ACETAMINOPHEN 325 MG PO TABS
650.0000 mg | ORAL_TABLET | ORAL | Status: DC | PRN
  Administered 2015-12-29: 650 mg via ORAL

## 2015-12-29 MED ORDER — MIDAZOLAM HCL 2 MG/2ML IJ SOLN
INTRAMUSCULAR | Status: DC | PRN
  Administered 2015-12-29: 1 mg via INTRAVENOUS

## 2015-12-29 MED ORDER — MIDAZOLAM HCL 2 MG/2ML IJ SOLN
INTRAMUSCULAR | Status: AC
  Filled 2015-12-29: qty 2

## 2015-12-29 MED ORDER — IOPAMIDOL (ISOVUE-300) INJECTION 61%
INTRAVENOUS | Status: DC | PRN
  Administered 2015-12-29: 100 mL via INTRA_ARTERIAL

## 2015-12-29 MED ORDER — SODIUM CHLORIDE 0.9 % IV SOLN
250.0000 mL | INTRAVENOUS | Status: DC
  Administered 2015-12-29: 1000 mL via INTRAVENOUS

## 2015-12-29 MED ORDER — HEPARIN SODIUM (PORCINE) 1000 UNIT/ML IJ SOLN
INTRAMUSCULAR | Status: AC
  Filled 2015-12-29: qty 1

## 2015-12-29 MED ORDER — MORPHINE SULFATE (PF) 2 MG/ML IV SOLN: 2.0000 mg | INTRAVENOUS | Status: DC | PRN

## 2015-12-29 MED ORDER — ASPIRIN 81 MG PO CHEW: 81.0000 mg | CHEWABLE_TABLET | ORAL | Status: DC

## 2015-12-29 MED ORDER — HEPARIN SODIUM (PORCINE) 1000 UNIT/ML IJ SOLN
INTRAMUSCULAR | Status: DC | PRN
  Administered 2015-12-29: 7000 [IU] via INTRAVENOUS

## 2015-12-29 MED ORDER — SODIUM CHLORIDE 0.9% FLUSH: 3.0000 mL | INTRAVENOUS | Status: DC | PRN

## 2015-12-29 MED ORDER — ONDANSETRON HCL 4 MG/2ML IJ SOLN: 4.0000 mg | Freq: Four times a day (QID) | INTRAMUSCULAR | Status: DC | PRN

## 2015-12-29 MED ORDER — SODIUM CHLORIDE 0.9 % IV SOLN
INTRAVENOUS | Status: DC
  Administered 2015-12-29: 13:00:00 via INTRAVENOUS

## 2015-12-29 MED ORDER — CLOPIDOGREL BISULFATE 75 MG PO TABS
ORAL_TABLET | ORAL | Status: AC
  Administered 2015-12-29: 75 mg via ORAL
  Filled 2015-12-29: qty 1

## 2015-12-29 MED ORDER — FENTANYL CITRATE (PF) 100 MCG/2ML IJ SOLN
INTRAMUSCULAR | Status: DC | PRN
  Administered 2015-12-29: 25 ug via INTRAVENOUS

## 2015-12-29 MED ORDER — CLOPIDOGREL BISULFATE 75 MG PO TABS
75.0000 mg | ORAL_TABLET | Freq: Every day | ORAL | Status: DC
  Administered 2015-12-29: 75 mg via ORAL

## 2015-12-29 MED ORDER — FENTANYL CITRATE (PF) 100 MCG/2ML IJ SOLN
INTRAMUSCULAR | Status: AC
  Filled 2015-12-29: qty 2

## 2015-12-29 MED ORDER — ACETAMINOPHEN 325 MG PO TABS
ORAL_TABLET | ORAL | Status: AC
  Filled 2015-12-29: qty 2

## 2015-12-29 SURGICAL SUPPLY — 9 items
CATH 5F 110X4 TIG (CATHETERS) ×3 IMPLANT
CATH BALLN WEDGE 5F 110CM (CATHETERS) ×3 IMPLANT
CATH INFINITI 5FR JL4 (CATHETERS) ×3 IMPLANT
DEVICE RAD COMP TR BAND LRG (VASCULAR PRODUCTS) ×3 IMPLANT
GLIDESHEATH SLEND A-KIT 6F 22G (SHEATH) ×6 IMPLANT
KIT MANI 3VAL PERCEP (MISCELLANEOUS) ×3 IMPLANT
KIT RIGHT HEART (MISCELLANEOUS) ×3 IMPLANT
PACK CARDIAC CATH (CUSTOM PROCEDURE TRAY) ×3 IMPLANT
WIRE SAFE-T 1.5MM-J .035X260CM (WIRE) ×3 IMPLANT

## 2015-12-29 NOTE — Interval H&P Note (Signed)
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   The various methods of treatment have been discussed with the patient and family. After consideration of risks, benefits and other options for treatment, the patient has consented to  Procedure(s): Right/Left Heart Cath and Coronary Angiography (Bilateral) with possible Percutaneous Coronary Intervention as a surgical intervention .  The patient's history has been reviewed, patient examined, no change in status, stable for surgery.  I have reviewed the patient's chart and labs.  Questions were answered to the patient's satisfaction.    Cath Lab Visit (complete for each Cath Lab visit)  Clinical Evaluation Leading to the Procedure:   ACS: No.  Non-ACS:    Anginal Classification: CCS IV  Anti-ischemic medical therapy: Maximal Therapy (2 or more classes of medications)  Non-Invasive Test Results: No non-invasive testing performed - High Risk Echo findings with severely reduced LVEF  Prior CABG: No previous CABG   Cardiomyopathies (Right and Left Heart Catheterization OR  Right Heart Catheterization Alone With/Without Left Ventriculography and Coronary Angiography)   Patient Information:    Known or suspected cardiomyopathy with or without heart failure  AUC Score:   A (7)   Indication:   93 Cardiomyopathies (Right and Left Heart Catheterization OR  Right Heart Catheterization Alone With/Without Left Ventriculography and Coronary Angiography)   Patient Information:    Re-evaluation of known cardiomyopathy   Change in clinical status or cardiac exam or to guide therapy  AUC Score:   A (7)   Indication:   94  PCI if Indicated: Ischemic Symptoms? CCS III (Marked limitation of ordinary activity) Anti-ischemic Medical Therapy? Maximal Medical Therapy (2 or  more classes of medications) Non-invasive Test Results? No non-invasive testing performed Prior CABG? No Previous CABG   Patient Information:   1-2V CAD, no prox LAD  A (7)  Indication: 20; Score: 7   Patient Information:   1-2V-CAD with DS 50-60% With No FFR, No IVUS  I (3)  Indication: 21; Score: 3   Patient Information:   1-2V-CAD with DS 50-60% With FFR  A (7)  Indication: 22; Score: 7   Patient Information:   1-2V-CAD with DS 50-60% With FFR>0.8, IVUS not significant  I (2)  Indication: 23; Score: 2   Patient Information:   3V-CAD without LMCA With Abnormal LV systolic function  A (9)  Indication: 48; Score: 9   Patient Information:   LMCA-CAD  A (9)  Indication: 49; Score: 9   Patient Information:   2V-CAD with prox LAD PCI  A (7)  Indication: 62; Score: 7   Patient Information:   2V-CAD with prox LAD CABG  A (8)  Indication: 62; Score: 8   Patient Information:   3V-CAD without LMCA With Low CAD burden(i.e., 3 focal stenoses, low SYNTAX score) PCI  A (7)  Indication: 63; Score: 7   Patient Information:   3V-CAD without LMCA With Low CAD burden(i.e., 3 focal stenoses, low SYNTAX score) CABG  A (9)  Indication: 63; Score: 9   Patient Information:   3V-CAD without LMCA E06c - Intermediate-high CAD burden (i.e., multiple diffuse lesions, presence of CTO, or high SYNTAX score) PCI  U (4)  Indication: 64; Score: 4   Patient Information:   3V-CAD without LMCA E06c - Intermediate-high CAD burden (i.e., multiple diffuse lesions, presence of CTO, or high  SYNTAX score) CABG  A (9)  Indication: 64; Score: 9   Patient Information:   LMCA-CAD With Isolated LMCA stenosis  PCI  U (6)  Indication: 65; Score: 6   Patient Information:   LMCA-CAD With Isolated LMCA stenosis  CABG  A (9)  Indication: 65; Score: 9   Patient Information:   LMCA-CAD Additional CAD, low CAD burden (i.e., 1- to 2-vessel additional  involvement, low SYNTAX score) PCI  U (5)  Indication: 66; Score: 5   Patient Information:   LMCA-CAD Additional CAD, low CAD burden (i.e., 1- to 2-vessel additional involvement, low SYNTAX score) CABG  A (9)  Indication: 66; Score: 9   Patient Information:   LMCA-CAD Additional CAD, intermediate-high CAD burden (i.e., 3-vessel involvement, presence of CTO, or high SYNTAX score) PCI  I (3)  Indication: 67; Score: 3   Patient Information:   LMCA-CAD Additional CAD, intermediate-high CAD burden (i.e., 3-vessel involvement, presence of CTO, or high SYNTAX score) CABG  A (9)  Indication: 67; Score: 9    Bryan Lemmaavid Harding

## 2015-12-29 NOTE — H&P (View-Only) (Signed)
Cardiology Office Note Date:  12/28/2015  Patient ID:  Thomas, Bender 03/27/1946, MRN [id number] PCP:  Thornton Papas, PA-C  Cardiologist:  Dr. Mariah Milling, MD    Chief Complaint: Hospital follow up  History of Present Illness: [name] is a 70 y.o. male with history of CAD s/p PCI/DES to the LAD in 2011, chronic systolic CHF/ICM (previously on Entresto though unable to afford, now back on lisinopril), dietary noncompliance, hypertensive heart disease, HLD, morbid obesity, stroke, poorly controlled DM2, remote ETOH abuse, and dizziness who presents for hospital follow up from recent admission to [facility] from 6/26-6/29 for acute on chronic systolic CHF/ICM and unstable angina.   Patient last underwent ischemic evaluation in 2013 with cardiac cath at that time showing patent LAD stent with moderate, nonobstructive more distal LAD disease with normal FFR. EF was noted to be 15% at that time. He was medically managed since. He was last seen in clinic in 02/2016. At that time, he was noting significant dizziness. Prior MRI suggested vertebrobailar disease and CTA was ordered to further evaluate. Unfortunately, the patient never followed up for imaging. Since 02/2016 his dizziness had improved. He has a very exertional job, working as a Marine scientist. He had noticed some slowing down around 05/2015 though was in his USOH until mid June with he noticed increased abdominal girth, DOE, weight gain, and orthopnea. He also noted rest and exertional chest pain. Because of these symptoms he presented to [facility] on 6/26. At that time his weight was noted to be 316 pounds (baseline around 300 pounds). Labs upon his arrival to [facility] showed a BNP of 97, CXR without acute findings, troponin normal, EKG with laterL TWI (old). He underwent successful IV diuresis with Lasix. Echo on 6/26 showed an EF of 20-25%, diffuse HK, GR1DD, moderately dilated LA, RV systolic function was normal, PASP could not be estimated. It  was initially recommended he undergo cardiac cath, though in follow up rounds this was put on hold by his primary cardiologist. Spironolactone was added prior to discharge.   Since his discharge, he saw CHF Clinic this morning and noted persistent SOB and fatigue. He was scheduled for a sleep study as he has never had one. He tells me his weight has been down trending at home with reading of 306 lbs on 7/16, 304 lbs on 7/17, and 302 lbs today at home. His weight at the CHF clinic was 308 lbs and 309 in our office. He has not needed to take and prn metolazone. He is taking his medications as directed including Lasix 80 mg bid. He has significantly cut back on his PO fluid consumption and no longer uses salt. He denies any increased orthopnea and no LE edema. He has not had any chest pain. He states if he could breathe better he would be happy.    Past Medical History  Diagnosis Date  . Type II diabetes mellitus (HCC)   . Hypertensive heart disease   . Hyperlipidemia   . Cerebrovascular accident [facility])     a. No residual weakness  . Chronic systolic CHF (congestive heart failure) (HCC)     a. 05/2012 Echo: EF 15%, impaired LV relaxation. Mod concentric LVH. mod dil LA. Severe global HK.  . Ischemic cardiomyopathy     a. 05/2012 Echo: EF 15%.  Marland Kitchen CAD (coronary artery disease)     a. 11/2009 Cath: LAD 90 (3.0x12 Xience DES);  b. 09/2011 LAD stent ok, 60d (nl FFR-->Med Rx).  Marland Kitchen  Morbid obesity (HCC)   . Dizziness     a. 01/2015 MRI: chronic left basal ganglia infarct, abnormal appearance of basialar and distal vertebral arteries suggestive of flow limiting stenoses, CTA recommended.    Past Surgical History  Procedure Laterality Date  . Knee surgery      left  . Knee cartilage surgery      right  . Cardiac catheterization  2013    showing 60% LAD disease after stent, no stent placed based on FFR pressure wire results  . Cardiac catheterization  2011    showing 90% LAD stenosis s/p stent placement   . Total hip arthroplasty Left 2014  . Lasik Bilateral 2015    eye sugeries  . Coronary angioplasty with stent placement      Current Outpatient Prescriptions  Medication Sig Dispense Refill  . amLODipine (NORVASC) 10 MG tablet Take 1 tablet (10 mg total) by mouth daily. 90 tablet 3  . aspirin 81 MG tablet Take 81 mg by mouth daily.    . carvedilol (COREG) 25 MG tablet Take 1 tablet (25 mg total) by mouth 2 (two) times daily with a meal. 90 tablet 3  . furosemide (LASIX) 80 MG tablet Take 80 mg by mouth 2 (two) times daily.    Marland Kitchen. lisinopril (PRINIVIL,ZESTRIL) 40 MG tablet Take 40 mg by mouth daily.    . metFORMIN (GLUCOPHAGE) 500 MG tablet Take 1 tablet (500 mg total) by mouth 2 (two) times daily with a meal. 180 tablet 3  . potassium chloride (K-DUR) 10 MEQ tablet Take 1 tablet (10 mEq total) by mouth 2 (two) times daily as needed. 60 tablet 6  . pravastatin (PRAVACHOL) 40 MG tablet Take 40 mg by mouth daily.     No current facility-administered medications for this visit.    Allergies:   Review of patient's allergies indicates no known allergies.   Social History:  The patient  reports that he has never smoked. He has never used smokeless tobacco. He reports that he does not drink alcohol or use illicit drugs.   Family History:  The patient's family history includes CAD in his brother, father, and sister.  ROS:   Review of Systems  Constitutional: Positive for weight loss and malaise/fatigue. Negative for fever, chills and diaphoresis.  HENT: Negative for congestion.   Eyes: Negative for discharge and redness.  Respiratory: Positive for shortness of breath. Negative for cough, hemoptysis, sputum production and wheezing.   Cardiovascular: Negative for chest pain, palpitations, orthopnea, claudication, leg swelling and PND.  Gastrointestinal: Negative for nausea, vomiting and abdominal pain.  Musculoskeletal: Positive for neck pain. Negative for myalgias and falls.  Skin: Negative  for rash.  Neurological: Positive for weakness. Negative for dizziness, sensory change, speech change, focal weakness and loss of consciousness.  Endo/Heme/Allergies: Does not bruise/bleed easily.  Psychiatric/Behavioral: Negative for substance abuse. The patient is not nervous/anxious.   All other systems reviewed and are negative.    PHYSICAL EXAM:  VS:  BP 94/52 mmHg  Pulse 91  Ht 6\' 5"  (1.956 m)  Wt 309 lb 6.4 oz (140.343 kg)  BMI 36.68 kg/m2  SpO2 94% BMI: Body mass index is 36.68 kg/(m^2).  Physical Exam  Constitutional: He is oriented to person, place, and time. He appears well-developed and well-nourished.  HENT:  Head: Normocephalic and atraumatic.  Eyes: Right eye exhibits no discharge. Left eye exhibits no discharge.  Neck: Normal range of motion. No JVD present.  Cardiovascular: Normal rate, regular rhythm, S1 normal,  S2 normal and normal heart sounds.  Exam reveals no distant heart sounds, no friction rub, no midsystolic click and no opening snap.   No murmur heard. Pulmonary/Chest: Effort normal and breath sounds normal. No respiratory distress. He has no decreased breath sounds. He has no wheezes. He has no rales. He exhibits no tenderness.  Abdominal: Soft. He exhibits no distension. There is no tenderness.  Musculoskeletal: He exhibits no edema.  Neurological: He is alert and oriented to person, place, and time.  Skin: Skin is warm and dry. No cyanosis. Nails show no clubbing.  Psychiatric: He has a normal mood and affect. His speech is normal and behavior is normal. Judgment and thought content normal.     EKG:  Was not ordered today.   Recent Labs: 12/06/2015: B Natriuretic Peptide 97.0 12/07/2015: Hemoglobin 13.7; Platelets 148* 12/09/2015: BUN 32*; Creatinine, Ser 1.39*; Potassium 3.8; Sodium 135  02/22/2015: Chol/HDL Ratio 8.1*; Cholesterol, Total 251*; HDL 31*; LDL Calculated 174*; Triglycerides 231*   Estimated Creatinine Clearance: 78.8 mL/min (by C-G  formula based on Cr of 1.39).   Wt Readings from Last 3 Encounters:  12/28/15 309 lb 6.4 oz (140.343 kg)  12/28/15 308 lb (139.708 kg)  12/09/15 308 lb 4.8 oz (139.844 kg)     Other studies reviewed: Additional studies/records reviewed today include: summarized above  ASSESSMENT AND PLAN:  1. Chronic systolic CHF/ICM/SOB: He does not appear to be volume overloaded at this time. He has been taking Lasix 80 mgm bid without the need for metolazone. Weight at home today was 302 (baseline weight approximately 300 pounds). He remains significantly SOB. Perhaps his SOB is multifactorial including cardiomyopathy with an EF of 25%, morbid obesity, and deconditioning, though at this time I am unable to rule out ischemia as a cause as well. Will schedule the patient for [facility] with Dr. Herbie Baltimore, MD on 7/19 given the above. He will hold metformin, Lasix, and lisinopril for pre-cath. Risks and benefits of cardiac catheterization have been discussed with the patient including risks of bleeding, bruising, infection, kidney damage, stroke, heart attack, and death. The patient understands these risks and is willing to proceed with the procedure. All questions have been answered and concerns listened to. Further management to be determined after cath results.   2. CAD as above: Scheduled for [facility] on 7/19 as above. Continue aspirin 81 mg and Coreg.   3. Hypertensive heart disease: BP is soft in our office at this time. Has been well controlled. Continue current medications for now unless we see his BP remaining soft.   4. HLD: On pravastatin.   5. Morbid obesity: Weight loss advised. He would benefit from cardiac rehab.   6. Fatigue/increased somnolence: He has been scheduled for a sleep study by the CHF clinic.   Disposition: F/u with myself or Dr. Mariah Milling, MD after cardiac cath   Current medicines are reviewed at length with the patient today.  The patient did not have any concerns regarding  medicines.  Elinor Dodge PA-C 12/28/2015 3:05 PM     [facility] [address] [place], [place] [address] [phone]

## 2015-12-29 NOTE — Discharge Instructions (Signed)
Groin Insertion Instructions-If you lose feeling or develop tingling or pain in your leg or foot after the procedure, please walk around first.  If the discomfort does not improve , contact your physician and proceed to the nearest emergency room.  Loss of feeling in your leg might mean that a blockage has formed in the artery and this can be appropriately treated.  Limit your activity for the next two days after your procedure.  Avoid stooping, bending, heavy lifting or exertion as this may put pressure on the insertion site.  Resume normal activities in 48 hours.  You may shower after 24 hours but avoid excessive warm water and do not scrub the site.  Remove clear dressing in 48 hours.  If you have had a closure device inserted, do not soak in a tub bath or a hot tub for at least one week. ° °No driving for 48 hours after discharge.  After the procedure, check the insertion site occasionally.  If any oozing occurs or there is apparent swelling, firm pressure over the site will prevent a bruise from forming.  You can not hurt anything by pressing directly on the site.  The pressure stops the bleeding by allowing a small clot to form.  If the bleeding continues after the pressure has been applied for more than 15 minutes, call 911 or go to the nearest emergency room.   ° ° °

## 2015-12-29 NOTE — Telephone Encounter (Signed)
S/w pt to review d/c instructions from L/R heart cath today. Pt states he has picked up plavix from pharmacy and will have labs drawn Friday morning at [facility]. He understands to hold metformin, restart lasix tonight then [facility] Pt verbalized understanding with no further questions at this time.

## 2015-12-29 NOTE — Progress Notes (Signed)
Dr. Herbie BaltimoreHarding out to speak with family. Will return on Monday for another procedure. Will return here on Friday for labs. Right radial site is clean, dry. No hematoma or pain. TR band in place. Patient tolerating well. Family at bedside. Able to tolerate PO liquids.

## 2015-12-29 NOTE — Progress Notes (Signed)
Patient underwent diagnostic cardiac cath on 7/19 with a SCr of 1.7. During the procedure 100 mL of contrast were used. Plan for staged procedure on 7/24 after hydration and possible PCI at that time pending repeat cath results. Orders have been placed for cardiac cath on 7/24 as well as repeat bmet to be drawn on 7/21.

## 2015-12-30 ENCOUNTER — Telehealth: Payer: Self-pay | Admitting: Cardiovascular Disease

## 2015-12-30 NOTE — Telephone Encounter (Signed)
S/w pt who [facility] July [facility] @ 8:30am for cath. Reviewed instructions. Pt verbalized understanding with no questions at this time.

## 2015-12-31 ENCOUNTER — Other Ambulatory Visit: Payer: Self-pay | Admitting: *Deleted

## 2015-12-31 ENCOUNTER — Telehealth: Payer: Self-pay | Admitting: Cardiovascular Disease

## 2015-12-31 ENCOUNTER — Encounter: Payer: Self-pay | Admitting: *Deleted

## 2015-12-31 ENCOUNTER — Other Ambulatory Visit
Admission: RE | Admit: 2015-12-31 | Discharge: 2015-12-31 | Disposition: A | Discharge status: Home / Self Care | Payer: Medicare Other | Source: Ambulatory Visit | Attending: Physician Assistant | Admitting: Physician Assistant

## 2015-12-31 DIAGNOSIS — N179 Acute kidney failure, unspecified: Secondary | ICD-10-CM | POA: Insufficient documentation

## 2015-12-31 LAB — BASIC METABOLIC PANEL
ANION GAP: 8 (ref 5–15)
BUN: 17 mg/dL (ref 6–20)
CALCIUM: 9.2 mg/dL (ref 8.9–10.3)
CO2: 28 mmol/L (ref 22–32)
Chloride: 100 mmol/L — ABNORMAL LOW (ref 101–111)
Creatinine, Ser: 1.08 mg/dL (ref 0.61–1.24)
GLUCOSE: 137 mg/dL — AB (ref 65–99)
POTASSIUM: 3.8 mmol/L (ref 3.5–5.1)
Sodium: 136 mmol/L (ref 135–145)

## 2015-12-31 MED ORDER — POTASSIUM CHLORIDE ER 10 MEQ PO TBCR: 10.0000 meq | EXTENDED_RELEASE_TABLET | Freq: Two times a day (BID) | ORAL | Status: AC | PRN

## 2015-12-31 NOTE — Telephone Encounter (Signed)
°*  STAT* If patient is at the pharmacy, call can be transferred to refill team.   1. Which medications need to be refilled? (please list name of each medication and dose if known) Potasium   2. Which pharmacy/location (including street and city if local pharmacy) is medication to be sent to[facility]  3. Do they need a 30 day or 90 day supply? 90 day

## 2015-12-31 NOTE — Telephone Encounter (Signed)
Requested Prescriptions   Signed Prescriptions Disp Refills  . potassium chloride (K-DUR) 10 MEQ tablet 180 tablet 3    Sig: Take 1 tablet (10 mEq total) by mouth 2 (two) times daily as needed.    Authorizing Provider: Antonieta IbaGOLLAN, TIMOTHY J    Ordering User: Kendrick FriesLOPEZ, Claris Guymon C

## 2016-01-05 ENCOUNTER — Encounter
Admission: RE | Disposition: A | Discharge status: Home / Self Care | Payer: Self-pay | Source: Ambulatory Visit | Attending: Cardiovascular Disease

## 2016-01-05 ENCOUNTER — Observation Stay
Admission: RE | Admit: 2016-01-05 | Discharge: 2016-01-06 | Disposition: A | Discharge status: Home / Self Care | Payer: Medicare Other | Source: Ambulatory Visit | Attending: Cardiovascular Disease | Admitting: Cardiovascular Disease

## 2016-01-05 DIAGNOSIS — I5042 Chronic combined systolic (congestive) and diastolic (congestive) heart failure: Secondary | ICD-10-CM | POA: Diagnosis not present

## 2016-01-05 DIAGNOSIS — Z8249 Family history of ischemic heart disease and other diseases of the circulatory system: Secondary | ICD-10-CM | POA: Insufficient documentation

## 2016-01-05 DIAGNOSIS — Z7984 Long term (current) use of oral hypoglycemic drugs: Secondary | ICD-10-CM | POA: Diagnosis not present

## 2016-01-05 DIAGNOSIS — I25119 Atherosclerotic heart disease of native coronary artery with unspecified angina pectoris: Secondary | ICD-10-CM | POA: Diagnosis present

## 2016-01-05 DIAGNOSIS — E876 Hypokalemia: Secondary | ICD-10-CM | POA: Insufficient documentation

## 2016-01-05 DIAGNOSIS — E119 Type 2 diabetes mellitus without complications: Secondary | ICD-10-CM | POA: Diagnosis not present

## 2016-01-05 DIAGNOSIS — I110 Hypertensive heart disease with heart failure: Principal | ICD-10-CM | POA: Insufficient documentation

## 2016-01-05 DIAGNOSIS — I25111 Atherosclerotic heart disease of native coronary artery with angina pectoris with documented spasm: Secondary | ICD-10-CM

## 2016-01-05 DIAGNOSIS — I255 Ischemic cardiomyopathy: Secondary | ICD-10-CM | POA: Diagnosis present

## 2016-01-05 DIAGNOSIS — Z8673 Personal history of transient ischemic attack (TIA), and cerebral infarction without residual deficits: Secondary | ICD-10-CM | POA: Insufficient documentation

## 2016-01-05 DIAGNOSIS — Z7982 Long term (current) use of aspirin: Secondary | ICD-10-CM | POA: Insufficient documentation

## 2016-01-05 DIAGNOSIS — I208 Other forms of angina pectoris: Secondary | ICD-10-CM | POA: Diagnosis present

## 2016-01-05 DIAGNOSIS — I2510 Atherosclerotic heart disease of native coronary artery without angina pectoris: Secondary | ICD-10-CM | POA: Insufficient documentation

## 2016-01-05 DIAGNOSIS — I119 Hypertensive heart disease without heart failure: Secondary | ICD-10-CM

## 2016-01-05 DIAGNOSIS — F329 Major depressive disorder, single episode, unspecified: Secondary | ICD-10-CM | POA: Insufficient documentation

## 2016-01-05 DIAGNOSIS — E785 Hyperlipidemia, unspecified: Secondary | ICD-10-CM | POA: Diagnosis not present

## 2016-01-05 DIAGNOSIS — Z96642 Presence of left artificial hip joint: Secondary | ICD-10-CM | POA: Diagnosis not present

## 2016-01-05 DIAGNOSIS — Z6835 Body mass index (BMI) 35.0-35.9, adult: Secondary | ICD-10-CM | POA: Diagnosis not present

## 2016-01-05 DIAGNOSIS — I2089 Other forms of angina pectoris: Secondary | ICD-10-CM | POA: Diagnosis present

## 2016-01-05 DIAGNOSIS — Z79899 Other long term (current) drug therapy: Secondary | ICD-10-CM | POA: Insufficient documentation

## 2016-01-05 HISTORY — PX: CARDIAC CATHETERIZATION: SHX172

## 2016-01-05 SURGERY — CORONARY STENT INTERVENTION
Anesthesia: Moderate Sedation | Laterality: Right

## 2016-01-05 MED ORDER — HEPARIN (PORCINE) IN NACL 2-0.9 UNIT/ML-% IJ SOLN
INTRAMUSCULAR | Status: AC
  Filled 2016-01-05: qty 500

## 2016-01-05 MED ORDER — ACETAMINOPHEN 325 MG PO TABS
650.0000 mg | ORAL_TABLET | ORAL | Status: DC | PRN
  Administered 2016-01-06: 650 mg via ORAL
  Filled 2016-01-05: qty 2

## 2016-01-05 MED ORDER — SPIRONOLACTONE 12.5 MG HALF TABLET
12.5000 mg | ORAL_TABLET | Freq: Every day | ORAL | Status: DC
  Administered 2016-01-06: 12.5 mg via ORAL
  Filled 2016-01-05 (×2): qty 1

## 2016-01-05 MED ORDER — SODIUM CHLORIDE 0.9 % IV SOLN: INTRAVENOUS | Status: AC

## 2016-01-05 MED ORDER — SODIUM CHLORIDE 0.9% FLUSH: 3.0000 mL | INTRAVENOUS | Status: DC | PRN

## 2016-01-05 MED ORDER — MIDAZOLAM HCL 2 MG/2ML IJ SOLN
INTRAMUSCULAR | Status: AC
  Filled 2016-01-05: qty 2

## 2016-01-05 MED ORDER — SODIUM CHLORIDE 0.9 % WEIGHT BASED INFUSION
3.0000 mL/kg/h | INTRAVENOUS | Status: AC
  Administered 2016-01-05: 3 mL/kg/h via INTRAVENOUS

## 2016-01-05 MED ORDER — ADENOSINE (DIAGNOSTIC) 140MCG/KG/MIN
INTRAVENOUS | Status: DC | PRN
  Administered 2016-01-05: 140 ug/kg/min via INTRAVENOUS

## 2016-01-05 MED ORDER — NITROGLYCERIN 5 MG/ML IV SOLN
INTRAVENOUS | Status: AC
  Filled 2016-01-05: qty 10

## 2016-01-05 MED ORDER — SODIUM CHLORIDE 0.9% FLUSH: 3.0000 mL | Freq: Two times a day (BID) | INTRAVENOUS | Status: DC

## 2016-01-05 MED ORDER — BIVALIRUDIN BOLUS VIA INFUSION - CUPID
INTRAVENOUS | Status: DC | PRN
Start: 2016-01-05 — End: 2016-01-05
  Administered 2016-01-05: 105.15 mg via INTRAVENOUS

## 2016-01-05 MED ORDER — LISINOPRIL 10 MG PO TABS
40.0000 mg | ORAL_TABLET | Freq: Every day | ORAL | Status: DC
  Administered 2016-01-05 – 2016-01-06 (×2): 40 mg via ORAL
  Filled 2016-01-05 (×2): qty 4

## 2016-01-05 MED ORDER — FENTANYL CITRATE (PF) 100 MCG/2ML IJ SOLN
INTRAMUSCULAR | Status: AC
  Filled 2016-01-05: qty 2

## 2016-01-05 MED ORDER — SODIUM CHLORIDE 0.9 % IV SOLN: 250.0000 mL | INTRAVENOUS | Status: DC | PRN

## 2016-01-05 MED ORDER — CLOPIDOGREL BISULFATE 75 MG PO TABS
75.0000 mg | ORAL_TABLET | Freq: Every day | ORAL | Status: DC
  Administered 2016-01-05 – 2016-01-06 (×2): 75 mg via ORAL
  Filled 2016-01-05 (×2): qty 1

## 2016-01-05 MED ORDER — SODIUM CHLORIDE 0.9 % IV SOLN
INTRAVENOUS | Status: DC | PRN
  Administered 2016-01-05: 1.75 mg/kg/h via INTRAVENOUS

## 2016-01-05 MED ORDER — CARVEDILOL 25 MG PO TABS
25.0000 mg | ORAL_TABLET | Freq: Two times a day (BID) | ORAL | Status: DC
  Administered 2016-01-06: 25 mg via ORAL
  Filled 2016-01-05 (×4): qty 1

## 2016-01-05 MED ORDER — ADENOSINE (DIAGNOSTIC) 3 MG/ML IV SOLN
INTRAVENOUS | Status: AC
  Filled 2016-01-05: qty 60

## 2016-01-05 MED ORDER — AMLODIPINE BESYLATE 5 MG PO TABS
10.0000 mg | ORAL_TABLET | Freq: Every day | ORAL | Status: DC
  Administered 2016-01-05 – 2016-01-06 (×2): 10 mg via ORAL
  Filled 2016-01-05 (×2): qty 2

## 2016-01-05 MED ORDER — FENTANYL CITRATE (PF) 100 MCG/2ML IJ SOLN
INTRAMUSCULAR | Status: DC | PRN
  Administered 2016-01-05 (×2): 50 ug via INTRAVENOUS

## 2016-01-05 MED ORDER — BIVALIRUDIN 250 MG IV SOLR
INTRAVENOUS | Status: AC
  Filled 2016-01-05: qty 500

## 2016-01-05 MED ORDER — ASPIRIN EC 81 MG PO TBEC
81.0000 mg | DELAYED_RELEASE_TABLET | ORAL | Status: DC
  Administered 2016-01-06: 81 mg via ORAL
  Filled 2016-01-05: qty 1

## 2016-01-05 MED ORDER — PRAVASTATIN SODIUM 40 MG PO TABS
40.0000 mg | ORAL_TABLET | Freq: Every day | ORAL | Status: DC
  Administered 2016-01-05: 40 mg via ORAL
  Filled 2016-01-05 (×4): qty 1

## 2016-01-05 MED ORDER — METOLAZONE 2.5 MG PO TABS
2.5000 mg | ORAL_TABLET | Freq: Every day | ORAL | Status: DC | PRN
  Filled 2016-01-05: qty 1

## 2016-01-05 MED ORDER — SODIUM CHLORIDE 0.9% FLUSH
3.0000 mL | Freq: Two times a day (BID) | INTRAVENOUS | Status: DC
  Administered 2016-01-05 – 2016-01-06 (×2): 3 mL via INTRAVENOUS

## 2016-01-05 MED ORDER — HEPARIN SODIUM (PORCINE) 1000 UNIT/ML IJ SOLN
INTRAMUSCULAR | Status: AC
  Filled 2016-01-05: qty 1

## 2016-01-05 MED ORDER — VERAPAMIL HCL 2.5 MG/ML IV SOLN
INTRAVENOUS | Status: AC
  Filled 2016-01-05: qty 2

## 2016-01-05 MED ORDER — MIDAZOLAM HCL 2 MG/2ML IJ SOLN
INTRAMUSCULAR | Status: DC | PRN
Start: 2016-01-05 — End: 2016-01-05
  Administered 2016-01-05 (×2): 1 mg via INTRAVENOUS

## 2016-01-05 MED ORDER — ONDANSETRON HCL 4 MG/2ML IJ SOLN: 4.0000 mg | Freq: Four times a day (QID) | INTRAMUSCULAR | Status: DC | PRN

## 2016-01-05 MED ORDER — NITROGLYCERIN 1 MG/10 ML FOR IR/CATH LAB
INTRA_ARTERIAL | Status: DC | PRN
  Administered 2016-01-05: 200 ug via INTRACORONARY

## 2016-01-05 MED ORDER — IOPAMIDOL (ISOVUE-300) INJECTION 61%
INTRAVENOUS | Status: DC | PRN
  Administered 2016-01-05: 180 mL via INTRA_ARTERIAL

## 2016-01-05 MED ORDER — VERAPAMIL HCL 2.5 MG/ML IV SOLN
INTRAVENOUS | Status: DC | PRN
  Administered 2016-01-05: 2.5 mg via INTRA_ARTERIAL

## 2016-01-05 MED ORDER — SODIUM CHLORIDE 0.9 % WEIGHT BASED INFUSION: 1.0000 mL/kg/h | INTRAVENOUS | Status: DC

## 2016-01-05 SURGICAL SUPPLY — 18 items
BALLN ~~LOC~~ TREK RX 2.5X12 (BALLOONS) ×3
BALLN ~~LOC~~ TREK RX 3.5X12 (BALLOONS) ×3
BALLN ~~LOC~~ TREK RX 3.5X15 (BALLOONS) ×3
BALLOON ~~LOC~~ TREK RX 2.5X12 (BALLOONS) ×1 IMPLANT
BALLOON ~~LOC~~ TREK RX 3.5X12 (BALLOONS) ×1 IMPLANT
BALLOON ~~LOC~~ TREK RX 3.5X15 (BALLOONS) ×1 IMPLANT
CATH VISTA GUIDE 6FR JR4 (CATHETERS) ×3 IMPLANT
CATH VISTA GUIDE 6FR XBLAD3.5 (CATHETERS) ×3 IMPLANT
DEVICE INFLAT 30 PLUS (MISCELLANEOUS) ×3 IMPLANT
DEVICE RAD COMP TR BAND LRG (VASCULAR PRODUCTS) ×3 IMPLANT
GLIDESHEATH SLEND SS 6F .021 (SHEATH) ×3 IMPLANT
KIT MANI 3VAL PERCEP (MISCELLANEOUS) ×3 IMPLANT
PACK CARDIAC CATH (CUSTOM PROCEDURE TRAY) ×3 IMPLANT
STENT XIENCE ALPINE RX 3.0X12 (Permanent Stent) ×3 IMPLANT
STENT XIENCE ALPINE RX 3.0X33 (Permanent Stent) ×3 IMPLANT
WIRE PRESSURE VERRATA (WIRE) ×3 IMPLANT
WIRE RUNTHROUGH .014X180CM (WIRE) ×3 IMPLANT
WIRE SAFE-T 1.5MM-J .035X260CM (WIRE) ×3 IMPLANT

## 2016-01-05 NOTE — Discharge Summary (Signed)
Discharge Summary    Patient ID: [name]  MRN: [id number], DOB/AGE: 15-Feb-1946 70 y.o.  Admit Date: 01/05/2016 Discharge Date: 01/06/2016  Primary Care Provider: Thornton Papas, PA-C Primary Cardiologist: Dr. Mariah Milling, MD  Discharge Diagnoses    Active Problems:   Chronic combined systolic and diastolic CHF, NYHA class 3 (HCC)   Coronary artery disease involving native coronary artery of native heart with angina pectoris [facility])   Effort angina (HCC)   Coronary artery disease   Allergies No Known Allergies   History of Present Illness     70 year old male with history of CAD s/p PCI/DES to the LAD in 2011, chronic systolic CHF/ICM (previously on Entresto though unable to afford, now back on lisinopril), dietary noncompliance, hypertensive heart disease, HLD, morbid obesity, stroke, poorly controlled DM2, remote ETOH abuse, and dizziness who presented to [facility] for staged PCI s/p diagnostic cardiac cath on 12/29/15.  Patient previously underwent ischemic evaluation in 2013 with cardiac cath at that time showing patent LAD stent with moderate, nonobstructive more distal LAD disease with normal FFR. EF was noted to be 15% at that time. He was medically managed since. He was seen in clinic in 02/2016. At that time, he was noting significant dizziness. Prior MRI suggested vertebrobailar disease and CTA was ordered to further evaluate. Unfortunately, the patient never followed up for imaging. Since 02/2016 his dizziness had improved. He has a very exertional job, working as a Marine scientist. He had noticed some slowing down around 05/2015 though was in his USOH until mid June with he noticed increased abdominal girth, DOE, weight gain, and orthopnea. He also noted rest and exertional chest pain. Because of these symptoms he presented to [facility] on 6/26. At that time his weight was noted to be 316 pounds (baseline around 300 pounds). Labs upon his arrival to [facility] showed a BNP of 97, CXR without  acute findings, troponin normal, EKG with laterL TWI (old). He underwent successful IV diuresis with Lasix. Echo on 6/26 showed an EF of 20-25%, diffuse HK, GR1DD, moderately dilated LA, RV systolic function was normal, PASP could not be estimated. It was initially recommended he undergo cardiac cath, though in follow up rounds this was put on hold by his primary cardiologist. Spironolactone was added prior to discharge. At hospital follow up on 12/28/15 he noted persistent SOB and fatigue. He tells me his weight has been down trending at home with reading of 306 lbs on 7/16, 304 lbs on 7/17, and 302 lbs on 7/18 at home. His weight at the CHF clinic on 7/18 was 308 lbs and 309 in our office same day. He had not needed to take and prn metolazone. He was taking his medications as directed including Lasix 80 mg bid. He had significantly cut back on his PO fluid consumption and no longer uses salt. He denied any increased orthopnea and no LE edema. He had not had any chest pain. He stated if he could breathe better he would be happy. He was scheduled for [facility] on 7/19 secondary to his symptoms which showed proximal LAD to mid LAD 30% stenosed with ISR, mid LAD 70%, mid RCA 75%, ramus 40%, normal RHC pressures indicating adequate diuresis and normal cardiac index. He did require 100 cc of contrast in this procedure and was noted to have a SCr of 1.7 at the time of the procedure. It was advised he hydrate some and come back in for staged procedure on 7/24 (though this was  postponed 2/2 AC outage at [facility] that day). Repeat renal function prior to the staged procedure showed improved renal function at 1.08.     Hospital Course     Consultants: Cardiac rehab   He presented as an outpatient on 7/26 and underwent staged LHC that showed mid LAD 70% s/p 2 overlapping DES after flow reserve evaluation showed an FFR ratio of 0.71. The RCA stenosis was not significant by FFR at 0.92. The procedure was overall difficult 2/2  diffuse calcified disease in the mid LAD segment. It was recommended he continue DAPT with aspirin and Plavix for at least 12 months and continue aggressive treatment of CAD and heart failure.   Labs as below. He was noted to be hypokalemic and was given KCl prior to discharge. His LDL was noted to be 122, thus pravastatin was changed to Lipitor. He feels much better this morning. Breathing is much better. He will restart metformin on 01/08/2016.   The patient's right radial cath site has been examined is healing well without issues at this time. The patient has been seen by Dr. Mariah Milling, MD and felt to be stable for discharge today. All follow up appointments have been made. Discharge medications are listed below. Prescriptions have been reviewed with the patient and printed/sent in to their pharmacy.  _____________  Discharge Vitals Blood pressure 127/81, pulse 71, temperature 98.3 F (36.8 C), temperature source Oral, resp. rate 13, height 6\' 5"  (1.956 m), weight (!) 303 lb 5.7 oz (137.6 kg), SpO2 98 %.  Filed Weights   01/05/16 1212 01/05/16 1815  Weight: (!) 309 lb (140.2 kg) (!) 303 lb 5.7 oz (137.6 kg)    Labs & Radiologic Studies    CBC  Recent Labs  01/06/16 0449  WBC 6.1  HGB 11.4*  HCT 32.9*  MCV 85.2  PLT 160   Basic Metabolic Panel  Recent Labs  01/06/16 0449  NA 139  K 3.5  CL 106  CO2 26  GLUCOSE 124*  BUN 9  CREATININE 0.85  CALCIUM 8.8*   Liver Function Tests No results for input(s): AST, ALT, ALKPHOS, BILITOT, PROT, ALBUMIN in the last 72 hours. No results for input(s): LIPASE, AMYLASE in the last 72 hours. Cardiac Enzymes No results for input(s): CKTOTAL, CKMB, CKMBINDEX, TROPONINI in the last 72 hours. BNP Invalid input(s): POCBNP D-Dimer No results for input(s): DDIMER in the last 72 hours. Hemoglobin A1C No results for input(s): HGBA1C in the last 72 hours. Fasting Lipid Panel  Recent Labs  01/06/16 0449  CHOL 173  HDL 24*  LDLCALC 122*    TRIG 134  CHOLHDL 7.2   Thyroid Function Tests No results for input(s): TSH, T4TOTAL, T3FREE, THYROIDAB in the last 72 hours.  Invalid input(s): FREET3 _____________  No results found.  Diagnostic Studies/Procedures   Staged diagnostic cardiac cath 01/05/2016: Coronary Findings   Dominance: Right  Left Main  Vessel is large.  Left Anterior Descending  Prox LAD to Mid LAD lesion, 70% stenosed. The lesion is discrete. In-stent restenosis The lesion was previously treatedover 2 years ago.  Angioplasty: Lesion crossed with guidewire. Pre-stent angioplasty was performed. A drug eluting stent was successfully placed. Stent strut is well apposed. The pre-interventional distal flow is normal (TIMI 3). The post-interventional distal flow is normal (TIMI 3). The intervention was successful . No complications occurred at this lesion.  There is no residual stenosis post intervention.  Mid LAD lesion, 70% stenosed. The lesion is tubular. This lesion is followed by a  short poststenotic dilation with the appears to be a possible calcific shelf.  Angioplasty: Lesion crossed with guidewire. Pre-stent angioplasty was performed. A drug eluting stent was successfully placed. Stent strut is well apposed. The pre-interventional distal flow is normal (TIMI 3). The post-interventional distal flow is normal (TIMI 3). The intervention was successful . No complications occurred at this lesion.  There is no residual stenosis post intervention.  First Diagonal Branch  Vessel is small in size.  First Septal Branch  Vessel is moderate in size.  Second Diagonal Branch  Vessel is small in size.  Third Septal Branch  Vessel is small in size.  Ramus Intermedius  Ramus lesion, 40% stenosed. The lesion is located at the bend and tubular.  Left Circumflex  First Obtuse Marginal Branch  Vessel is small in size.  Lateral Second Obtuse Marginal Branch  Vessel is small in size.  Right Coronary Artery  Vessel is  large.  Mid RCA lesion, 75% stenosed. The lesion is discrete and ulcerative. Has an appearance of possible focal dissection. Cannot exclude small branch vessel however.  Acute Marginal Branch  Vessel is small in size.  Coronary Diagrams   Diagnostic Diagram     Post-Intervention Diagram       _____________  Disposition   Pt is being discharged home today in good condition.  Follow-up Plans & Appointments    Follow-up Information    Eula Listen, PA-C Follow up on 01/10/2016.   Specialties:  Physician Assistant, Cardiology, Radiology Why:  Appointment Time: 2:00 PM Contact information: [address] [place] [address] [phone]          Discharge Instructions    (HEART FAILURE PATIENTS) Call MD:  Anytime you have any of the following symptoms: 1) 3 pound weight gain in 24 hours or 5 pounds in 1 week 2) shortness of breath, with or without a dry hacking cough 3) swelling in the hands, feet or stomach 4) if you have to sleep on extra pillows at night in order to breathe.    Complete by:  As directed   AMB Referral to Cardiac Rehabilitation - Phase II    Complete by:  As directed   Diagnosis:   Stable Angina PTCA     Call MD for:  difficulty breathing, headache or visual disturbances    Complete by:  As directed   Call MD for:  extreme fatigue    Complete by:  As directed   Call MD for:  redness, tenderness, or signs of infection (pain, swelling, redness, odor or green/yellow discharge around incision site)    Complete by:  As directed   Call MD for:  severe uncontrolled pain    Complete by:  As directed   Call MD for:  temperature >100.4    Complete by:  As directed   Diet - low sodium heart healthy    Complete by:  As directed   Discharge instructions    Complete by:  As directed   - You may resume metformin on 01/08/2016.  - No heavy lifting over 5 pounds with your right arm for the first week. After 7 days you may start slowly start lifting weights  greater than 5 pounds. - No baths for 7 days. You may take a shower.  - No swimming for 7 days.  - No driving for 62-95 hours.  - No sex for 7 days.  - Take all medications as directed.   Increase activity slowly  Complete by:  As directed      Discharge Medications   Current Discharge Medication List    START taking these medications   Details  atorvastatin (LIPITOR) 40 MG tablet Take 1 tablet (40 mg total) by mouth daily at 6 PM. Qty: 30 tablet, Refills: 11      CONTINUE these medications which have CHANGED   Details  metFORMIN (GLUCOPHAGE) 500 MG tablet Take 1 tablet (500 mg total) by mouth 2 (two) times daily with a meal. Restart metformin on 01/08/2016. Qty: 180 tablet, Refills: 3      CONTINUE these medications which have NOT CHANGED   Details  amLODipine (NORVASC) 10 MG tablet Take 1 tablet (10 mg total) by mouth daily. Qty: 90 tablet, Refills: 3    aspirin EC 81 MG tablet Take 81 mg by mouth every morning.    carvedilol (COREG) 25 MG tablet Take 1 tablet (25 mg total) by mouth 2 (two) times daily with a meal. Qty: 90 tablet, Refills: 3    clopidogrel (PLAVIX) 75 MG tablet Take 1 tablet (75 mg total) by mouth daily. Qty: 30 tablet, Refills: 12    furosemide (LASIX) 80 MG tablet Take 80 mg by mouth 2 (two) times daily.    lisinopril (PRINIVIL,ZESTRIL) 40 MG tablet Take 40 mg by mouth daily.    metolazone (ZAROXOLYN) 2.5 MG tablet Take 2.5 mg by mouth daily as needed (fluid).     potassium chloride (K-DUR) 10 MEQ tablet Take 1 tablet (10 mEq total) by mouth 2 (two) times daily as needed. Qty: 180 tablet, Refills: 3    spironolactone (ALDACTONE) 25 MG tablet Take 12.5 mg by mouth daily.      STOP taking these medications     pravastatin (PRAVACHOL) 40 MG tablet          Aspirin prescribed at discharge?  Yes High Intensity Statin Prescribed? (Lipitor 40-80mg  or Crestor 20-40mg ): Yes Beta Blocker Prescribed? Yes For EF <40%, was ACEI/ARB Prescribed?  Yes ADP Receptor Inhibitor Prescribed? (i.e. Plavix etc.-Includes Medically Managed Patients): Yes For EF <40%, Aldosterone Inhibitor Prescribed? Yes Was EF assessed during THIS hospitalization? No: EF was assessed last week during cardiac cath Was Cardiac Rehab II ordered? (Included Medically managed Patients): Yes   Outstanding Labs/Studies   Recheck bmet at hospital follow up  Duration of Discharge Encounter   Greater than 30 minutes including physician time.  [facility]  Pager: [phone] 01/06/2016, 7:50 AM

## 2016-01-05 NOTE — H&P (View-Only) (Signed)
Subjective:    Patient ID: [name], male    DOB: Mar 06, 1946, 70 y.o.   MRN: [id number]  Congestive Heart Failure Presents for initial visit. The disease course has been stable. Associated symptoms include fatigue, palpitations ("at times") and shortness of breath. Pertinent negatives include no abdominal pain, chest pain, chest pressure, edema or orthopnea. The symptoms have been worsening. Past treatments include ACE inhibitors, aldosterone receptor blockers, beta blockers and salt and fluid restriction. The treatment provided mild relief. Compliance with prior treatments has been good. His past medical history is significant for CVA, DM and HTN. He has multiple 1st degree relatives with heart disease.  Hypertension This is a chronic problem. The current episode started more than 1 year ago. The problem is controlled. Associated symptoms include malaise/fatigue, palpitations ("at times") and shortness of breath. Pertinent negatives include no chest pain, headaches, neck pain, orthopnea, peripheral edema or PND. There are no associated agents to hypertension. Risk factors for coronary artery disease include diabetes mellitus, dyslipidemia, family history, male gender, obesity and stress. Past treatments include ACE inhibitors, beta blockers, calcium channel blockers, diuretics and lifestyle changes. The current treatment provides significant improvement. Compliance problems include exercise.  Hypertensive end-organ damage includes CAD/MI, CVA and heart failure.  Other This is a new (depression) problem. The current episode started more than 1 year ago. The problem occurs daily. The problem has been gradually worsening. Associated symptoms include arthralgias ("chronic right shoulder"), congestion and fatigue. Pertinent negatives include no abdominal pain, chest pain, coughing, headaches, nausea, neck pain, numbness, sore throat or weakness. The symptoms are aggravated by stress. He has tried rest  and relaxation for the symptoms. The treatment provided no relief.    Past Medical History  Diagnosis Date  . Type II diabetes mellitus (HCC)   . Hypertensive heart disease   . Hyperlipidemia   . Cerebrovascular accident [facility])     a. No residual weakness  . Chronic systolic CHF (congestive heart failure) (HCC)     a. 05/2012 Echo: EF 15%, impaired LV relaxation. Mod concentric LVH. mod dil LA. Severe global HK.  . Ischemic cardiomyopathy     a. 05/2012 Echo: EF 15%.  Marland Kitchen CAD (coronary artery disease)     a. 11/2009 Cath: LAD 90 (3.0x12 Xience DES);  b. 09/2011 LAD stent ok, 60d (nl FFR-->Med Rx).  . Morbid obesity (HCC)   . Dizziness     a. 01/2015 MRI: chronic left basal ganglia infarct, abnormal appearance of basialar and distal vertebral arteries suggestive of flow limiting stenoses, CTA recommended.    Past Surgical History  Procedure Laterality Date  . Knee surgery      left  . Knee cartilage surgery      right  . Cardiac catheterization  2013    showing 60% LAD disease after stent, no stent placed based on FFR pressure wire results  . Cardiac catheterization  2011    showing 90% LAD stenosis s/p stent placement  . Total hip arthroplasty Left 2014  . Lasik Bilateral 2015    eye sugeries  . Coronary angioplasty with stent placement      Family History  Problem Relation Age of Onset  . CAD Father     died @ 22 of MI - CAD dx in his 16's.  Marland Kitchen CAD Brother     several brothers died of MI  . CAD Sister     died of MI.    Social History  Substance Use Topics  .  Smoking status: Never Smoker   . Smokeless tobacco: Never Used  . Alcohol Use: No    No Known Allergies  Prior to Admission medications   Medication Sig Start Date End Date Taking? Authorizing Provider  amLODipine (NORVASC) 10 MG tablet Take 1 tablet (10 mg total) by mouth daily. 03/16/14  Yes Antonieta Iba, MD  aspirin 81 MG tablet Take 81 mg by mouth daily.   Yes Historical Provider, MD  carvedilol  (COREG) 25 MG tablet Take 1 tablet (25 mg total) by mouth 2 (two) times daily with a meal. 03/16/14  Yes Antonieta Iba, MD  furosemide (LASIX) 80 MG tablet Take 80 mg by mouth 2 (two) times daily.   Yes Historical Provider, MD  lisinopril (PRINIVIL,ZESTRIL) 40 MG tablet Take 40 mg by mouth daily.   Yes Historical Provider, MD  metFORMIN (GLUCOPHAGE) 500 MG tablet Take 1 tablet (500 mg total) by mouth 2 (two) times daily with a meal. 01/14/15  Yes Antonieta Iba, MD  metolazone (ZAROXOLYN) 2.5 MG tablet Take 1 tablet (2.5 mg total) by mouth daily as needed (Take if 3 Lb or higher weight gain noted in one day or 6 Lb weight gain in 3-4 days over baseline weight.). 12/09/15  Yes Altamese Dilling, MD  potassium chloride (K-DUR) 10 MEQ tablet Take 1 tablet (10 mEq total) by mouth 2 (two) times daily as needed. 03/16/14  Yes Antonieta Iba, MD  spironolactone (ALDACTONE) 25 MG tablet Take 0.5 tablets (12.5 mg total) by mouth daily. 12/09/15  Yes Altamese Dilling, MD  atorvastatin (LIPITOR) 40 MG tablet Take 1 tablet (40 mg total) by mouth daily at 6 PM. 12/09/15   Altamese Dilling, MD     Review of Systems  Constitutional: Positive for malaise/fatigue and fatigue. Negative for appetite change.  HENT: Positive for congestion. Negative for postnasal drip and sore throat.   Eyes: Negative.   Respiratory: Positive for shortness of breath. Negative for cough and chest tightness.   Cardiovascular: Positive for palpitations ("at times"). Negative for chest pain, orthopnea, leg swelling and PND.  Gastrointestinal: Negative for nausea, abdominal pain and abdominal distention.  Endocrine: Negative.   Genitourinary: Negative.   Musculoskeletal: Positive for arthralgias ("chronic right shoulder"). Negative for neck pain.  Skin: Negative.   Allergic/Immunologic: Negative.   Neurological: Positive for light-headedness. Negative for weakness, numbness and headaches.  Hematological: Negative for  adenopathy. Does not bruise/bleed easily.  Psychiatric/Behavioral: Positive for sleep disturbance (sleeping on 2 pillows: not waking rested) and dysphoric mood. The patient is not nervous/anxious.        Objective:   Physical Exam  Constitutional: He is oriented to person, place, and time. He appears well-developed and well-nourished.  HENT:  Head: Normocephalic and atraumatic.  Eyes: Conjunctivae are normal. Pupils are equal, round, and reactive to light.  Neck: Normal range of motion. Neck supple.  Cardiovascular: Normal rate and regular rhythm.   Pulmonary/Chest: Effort normal. He has no wheezes. He has no rales.  Abdominal: Soft. He exhibits no distension. There is no tenderness.  Musculoskeletal: He exhibits no edema or tenderness.  Neurological: He is alert and oriented to person, place, and time.  Skin: Skin is warm and dry.  Psychiatric: His behavior is normal. Thought content normal. His affect is not angry. He exhibits a depressed mood.  Nursing note and vitals reviewed.   BP 115/65 mmHg  Pulse 80  Resp 18  Ht 6\' 5"  (1.956 m)  Wt 308 lb (139.708 kg)  BMI  36.52 kg/m2  SpO2 99%       Assessment & Plan:  1: Chronic heart failure with reduced ejection fraction- Patient presents with chronic fatigue and shortness of breath. He says that he gets tired doing very little (Class III) but that he doesn't have any symptoms at rest. He denies any chest pain or swelling in his legs or abdomen. He is already weighing himself and has metolazone at home that he can take if needed for weight gain. Reminded to call for an overnight weight gain of >2 pounds or a weekly weight gain of >5 pounds. He is not adding any salt to his food but admits that he's not reading food labels and he tends to eat out more often than cooking at home. Says that it's difficult to cook for just 1 person but when he goes out to eat, he tries to pick low sodium foods. Discussed the importance of following a 2000mg   sodium diet and written dietary information was given to him. He says that he's already limiting his fluid intake to 2L daily but says that he gets thirsty during the day (he's unsure of what his glucose is). Discussed using sugar free candy to suck on or sugar free gum to chew. Says that he was on entresto once before but had to be taken off of it due to dizziness. Discussed possibly switching his lisinopril to that again but if it was done, may need to decrease his amlodipine or furosemide to allow for more blood pressure to work with.  2: HTN- Blood pressure looks good but is on the lower end.  3: Chronic fatigue- He says that he's not sleeping well and wakes up feeling just as tired as when he went to bed. Says that he feels tired all day long. Discussed ordering a sleep study to rule out sleep apnea and patient is agreeable to this. Faxed in referral form to sleep-med so they can contact him for the appointment. 4: Depression- Patient does endorse feeling depressed which could certainly be contributing to his fatigue. He says that his mom died about 3 years ago and he had been living with her for many years. Also recently had a brother die along with numerous friends of his. Denied suicidal thoughts. Discussed how untreated depression can affect his health but he's concerned that he would have to go the hospital for treatment. Informed him that his PCP could easily start an antidepressant, if needed, in the office and there wouldn't be any need for hospital admittance. Patient says that he'll address this with his PCP.   Medication list that patient brought was reviewed.  Return here in 1 month or sooner for any questions/problems before then.

## 2016-01-05 NOTE — Progress Notes (Signed)
Pt is in stable condition at this time. Site is at a level 0. No bruising, swelling, or bleeding. Pt has denied pain on my shift. Report given to [facility], Charity fundraiser.

## 2016-01-05 NOTE — Interval H&P Note (Signed)
Cath Lab Visit (complete for each Cath Lab visit)  Clinical Evaluation Leading to the Procedure:   ACS: No.  Non-ACS:    Anginal Classification: CCS III  Anti-ischemic medical therapy: Maximal Therapy (2 or more classes of medications)  Non-Invasive Test Results: No non-invasive testing performed  Prior CABG: No previous CABG      History and Physical Interval Note:  01/05/2016 2:39 PM  [name]  has presented today for surgery, with the diagnosis of PCI   LT cath  The various methods of treatment have been discussed with the patient and family. After consideration of risks, benefits and other options for treatment, the patient has consented to  Procedure(s): Coronary Stent Intervention (Right) as a surgical intervention .  The patient's history has been reviewed, patient examined, no change in status, stable for surgery.  I have reviewed the patient's chart and labs.  Questions were answered to the patient's satisfaction.     Lorine Bears

## 2016-01-06 ENCOUNTER — Encounter: Payer: Self-pay | Admitting: Cardiovascular Disease

## 2016-01-06 ENCOUNTER — Other Ambulatory Visit: Payer: Self-pay | Admitting: Physician Assistant

## 2016-01-06 DIAGNOSIS — I255 Ischemic cardiomyopathy: Secondary | ICD-10-CM

## 2016-01-06 DIAGNOSIS — I110 Hypertensive heart disease with heart failure: Secondary | ICD-10-CM

## 2016-01-06 DIAGNOSIS — I25111 Atherosclerotic heart disease of native coronary artery with angina pectoris with documented spasm: Secondary | ICD-10-CM

## 2016-01-06 DIAGNOSIS — I5042 Chronic combined systolic (congestive) and diastolic (congestive) heart failure: Secondary | ICD-10-CM

## 2016-01-06 DIAGNOSIS — I119 Hypertensive heart disease without heart failure: Secondary | ICD-10-CM

## 2016-01-06 DIAGNOSIS — I25119 Atherosclerotic heart disease of native coronary artery with unspecified angina pectoris: Secondary | ICD-10-CM | POA: Diagnosis not present

## 2016-01-06 DIAGNOSIS — I208 Other forms of angina pectoris: Secondary | ICD-10-CM

## 2016-01-06 LAB — CBC
HEMATOCRIT: 32.9 % — AB (ref 40.0–52.0)
HEMOGLOBIN: 11.4 g/dL — AB (ref 13.0–18.0)
MCH: 29.7 pg (ref 26.0–34.0)
MCHC: 34.8 g/dL (ref 32.0–36.0)
MCV: 85.2 fL (ref 80.0–100.0)
Platelets: 160 10*3/uL (ref 150–440)
RBC: 3.86 MIL/uL — ABNORMAL LOW (ref 4.40–5.90)
RDW: 13.2 % (ref 11.5–14.5)
WBC: 6.1 10*3/uL (ref 3.8–10.6)

## 2016-01-06 LAB — BASIC METABOLIC PANEL
Anion gap: 7 (ref 5–15)
BUN: 9 mg/dL (ref 6–20)
CHLORIDE: 106 mmol/L (ref 101–111)
CO2: 26 mmol/L (ref 22–32)
Calcium: 8.8 mg/dL — ABNORMAL LOW (ref 8.9–10.3)
Creatinine, Ser: 0.85 mg/dL (ref 0.61–1.24)
GFR calc Af Amer: >60 mL/min (ref >60)
GLUCOSE: 124 mg/dL — AB (ref 65–99)
Potassium: 3.5 mmol/L (ref 3.5–5.1)
Sodium: 139 mmol/L (ref 135–145)

## 2016-01-06 LAB — LIPID PANEL
CHOLESTEROL: 173 mg/dL (ref 0–200)
HDL: 24 mg/dL — AB (ref >40)
LDL CALC: 122 mg/dL — AB (ref 0–99)
TRIGLYCERIDES: 134 mg/dL (ref <150)
Total CHOL/HDL Ratio: 7.2 RATIO
VLDL: 27 mg/dL (ref 0–40)

## 2016-01-06 MED ORDER — ATORVASTATIN CALCIUM 40 MG PO TABS: 40.0000 mg | ORAL_TABLET | Freq: Every day | ORAL | 11 refills | Status: DC

## 2016-01-06 MED ORDER — ATORVASTATIN CALCIUM 20 MG PO TABS: 40.0000 mg | ORAL_TABLET | Freq: Every day | ORAL | Status: DC

## 2016-01-06 MED ORDER — METFORMIN HCL 500 MG PO TABS: 500.0000 mg | ORAL_TABLET | Freq: Two times a day (BID) | ORAL | 3 refills | Status: AC

## 2016-01-06 MED ORDER — FUROSEMIDE 80 MG PO TABS: 80.0000 mg | ORAL_TABLET | Freq: Two times a day (BID) | ORAL | 3 refills | Status: AC

## 2016-01-06 MED ORDER — LISINOPRIL 40 MG PO TABS: 40.0000 mg | ORAL_TABLET | Freq: Every day | ORAL | 3 refills | Status: AC

## 2016-01-06 MED ORDER — POTASSIUM CHLORIDE CRYS ER 20 MEQ PO TBCR
40.0000 meq | EXTENDED_RELEASE_TABLET | Freq: Once | ORAL | Status: AC
  Administered 2016-01-06: 40 meq via ORAL
  Filled 2016-01-06: qty 2

## 2016-01-06 MED ORDER — ATORVASTATIN CALCIUM 40 MG PO TABS: 40.0000 mg | ORAL_TABLET | Freq: Every day | ORAL | 3 refills | Status: AC

## 2016-01-06 MED ORDER — CLOPIDOGREL BISULFATE 75 MG PO TABS: 75.0000 mg | ORAL_TABLET | Freq: Every day | ORAL | 3 refills | Status: AC

## 2016-01-06 MED ORDER — METOLAZONE 2.5 MG PO TABS: 2.5000 mg | ORAL_TABLET | Freq: Every day | ORAL | 3 refills | Status: AC | PRN

## 2016-01-06 MED ORDER — METFORMIN HCL 500 MG PO TABS: 500.0000 mg | ORAL_TABLET | Freq: Two times a day (BID) | ORAL | 3 refills | Status: DC

## 2016-01-06 MED ORDER — CARVEDILOL 25 MG PO TABS: 25.0000 mg | ORAL_TABLET | Freq: Two times a day (BID) | ORAL | 3 refills | Status: AC

## 2016-01-06 NOTE — Progress Notes (Signed)
Patient: [name] / Admit Date: 01/05/2016 / Date of Encounter: 01/06/2016, 7:58 AM   Subjective: Status post diagnostic cardiac cath on 7/26 with 2 overlapping DES to the mid LAD after this was found to be significant by FFR of 0.71. The lesion along the RCA was not found to be significant at 0.92. He feels much better this morning s/p PCI as above. Breathing is much better. No pain. No complaints this morning.   Review of Systems: Review of Systems  Constitutional: Negative for chills, diaphoresis, fever, malaise/fatigue and weight loss.  HENT: Negative for congestion.   Eyes: Negative for discharge and redness.  Respiratory: Positive for shortness of breath. Negative for cough, sputum production and wheezing.   Cardiovascular: Negative for chest pain, palpitations, orthopnea, claudication, leg swelling and PND.  Gastrointestinal: Negative for abdominal pain, blood in stool, heartburn, melena, nausea and vomiting.  Genitourinary: Negative for hematuria.  Musculoskeletal: Negative for falls and myalgias.  Skin: Negative for rash.  Neurological: Negative for dizziness, tingling, tremors, sensory change, speech change, focal weakness, loss of consciousness and weakness.  Endo/Heme/Allergies: Does not bruise/bleed easily.  Psychiatric/Behavioral: Negative for substance abuse. The patient is not nervous/anxious.   All other systems reviewed and are negative.   Objective: Telemetry: NSR, 70's bpm Physical Exam: Blood pressure 127/81, pulse 71, temperature 98.3 F (36.8 C), temperature source Oral, resp. rate 13, height 6\' 5"  (1.956 m), weight (!) 303 lb 5.7 oz (137.6 kg), SpO2 98 %. Body mass index is 35.97 kg/m. General: Well developed, well nourished, in no acute distress. Head: Normocephalic, atraumatic, sclera non-icteric, no xanthomas, nares are without discharge. Neck: Negative for carotid bruits. JVP not elevated. Lungs: Clear bilaterally to auscultation without wheezes,  rales, or rhonchi. Breathing is unlabored. Heart: RRR S1 S2 without murmurs, rubs, or gallops.  Abdomen: Soft, non-tender, non-distended with normoactive bowel sounds. No rebound/guarding. Extremities: No clubbing or cyanosis. No edema. Distal pedal pulses are 2+ and equal bilaterally. Right radial cath site well healing without bleeding, bruising, swelling, erythema, or TTP. Distal pulse 2+.  Neuro: Alert and oriented X 3. Moves all extremities spontaneously. Psych:  Responds to questions appropriately with a normal affect.   Intake/Output Summary (Last 24 hours) at 01/06/16 0758 Last data filed at 01/06/16 0500  Gross per 24 hour  Intake                0 ml  Output             1650 ml  Net            -1650 ml    Inpatient Medications:  . amLODipine  10 mg Oral Daily  . aspirin EC  81 mg Oral [id number]  . atorvastatin  40 mg Oral q1800  . carvedilol  25 mg Oral BID WC  . clopidogrel  75 mg Oral Daily  . lisinopril  40 mg Oral Daily  . potassium chloride  40 mEq Oral Once  . sodium chloride flush  3 mL Intravenous Q12H  . spironolactone  12.5 mg Oral Daily   Infusions:    Labs:  Recent Labs  01/06/16 0449  NA 139  K 3.5  CL 106  CO2 26  GLUCOSE 124*  BUN 9  CREATININE 0.85  CALCIUM 8.8*   No results for input(s): AST, ALT, ALKPHOS, BILITOT, PROT, ALBUMIN in the last 72 hours.  Recent Labs  01/06/16 0449  WBC 6.1  HGB 11.4*  HCT 32.9*  MCV 85.2  PLT 160   No results for input(s): CKTOTAL, CKMB, TROPONINI in the last 72 hours. Invalid input(s): POCBNP No results for input(s): HGBA1C in the last 72 hours.   Weights: Filed Weights   01/05/16 1212 01/05/16 1815  Weight: (!) 309 lb (140.2 kg) (!) 303 lb 5.7 oz (137.6 kg)     Radiology/Studies:  No results found.   Assessment and Plan  Principal Problem:   Coronary artery disease involving native coronary artery of native heart with angina pectoris (HCC) Active Problems:   Chronic combined systolic and  diastolic CHF, NYHA class 3 (HCC)   Cardiomyopathy, ischemic   Effort angina (HCC)   Hypertensive heart disease    1. CAD s/p recent PCI as above: -Much improved -No further complaints.  -Continue DAPT for at least the next 12 months with aspirin and Plavix -Cardiac rehab -Coreg  2. Chronic systolic CHF/ICM/SOB: -SOB much improved -Weight approximately at his baseline (near 300 lbs) -Continue Lasix, Coreg, spironolactone, lisinopril, and prn metolazone  -CHF education  -Watch for weight gain  3. Hypokalemia: -Continue KCl repletion at home -Will give an extra 40 meq this morning -Recheck bmet at hospital follow up  4. Hypertensive heart disease: -BP is well controlled -Continue current medications as above  5. HLD: -Change pravastatin to Lipitor  6. Morbid obesity: -Cardiac rehab   Signed, Carola Frost [facility] Pager: [phone] 01/06/2016, 7:58 AM      Attending Note Patient seen and examined, agree with detailed note above,  Patient presentation and plan discussed on rounds.   Reports he feels well after LAD stenting yesterday, no wrist pain or hematoma Feels like his shortness of breath has already improved Wife present in the room with him, long discussion concerning his management  Lungs clear to auscultation, heart sounds regular, obese, abdomen soft, nontender, no leg edema  Lab work appears stable including renal function Total cholesterol 173  --cad, s/p stent On aspirin, Plavix Changed to Lipitor to reach goal total cholesterol less than 150 Recommended cardiac rehabilitation given very deconditioned, short of breath, cardiomyopathy  --Cardiomyopathy, ischemic We'll continue current medications We'll discuss changing to entresto as an outpatient.  We did attempt this in the past, changed back possibly for cost reasons.  --Hyperlipidemia Agree with aggressive lipid management  --- Obesity, deconditioning Needs cardiac  rehabilitation Wife agrees with this, patient needs some convincing  He is scheduled for follow-up next week with me in clinic   Greater than 50% was spent in counseling and coordination of care with patient Total encounter time 25 minutes or more   Signed: Dossie Arbour  M.D., Ph.D. [facility]

## 2016-01-06 NOTE — Progress Notes (Signed)
Patient discharged home, all scrips sent to his pharmacy, IVs DCd, bleeding controlled, radial site level 0, no CP or SOB.  VS WDLPt given his DC instructions, understanding verbalized.  Left hospital with his SO

## 2016-01-09 ENCOUNTER — Encounter: Payer: Self-pay | Admitting: Physician Assistant

## 2016-01-10 ENCOUNTER — Ambulatory Visit: Payer: Medicare Other | Admitting: Physician Assistant

## 2016-01-10 ENCOUNTER — Ambulatory Visit (INDEPENDENT_AMBULATORY_CARE_PROVIDER_SITE_OTHER): Payer: Medicare Other | Admitting: Physician Assistant

## 2016-01-10 ENCOUNTER — Encounter: Payer: Self-pay | Admitting: Physician Assistant

## 2016-01-10 VITALS — BP 102/64 | HR 74 | Ht 77.0 in | Wt 304.8 lb

## 2016-01-10 DIAGNOSIS — I2510 Atherosclerotic heart disease of native coronary artery without angina pectoris: Secondary | ICD-10-CM

## 2016-01-10 DIAGNOSIS — I5022 Chronic systolic (congestive) heart failure: Secondary | ICD-10-CM

## 2016-01-10 DIAGNOSIS — I255 Ischemic cardiomyopathy: Secondary | ICD-10-CM

## 2016-01-10 DIAGNOSIS — I110 Hypertensive heart disease with heart failure: Secondary | ICD-10-CM

## 2016-01-10 DIAGNOSIS — R0602 Shortness of breath: Secondary | ICD-10-CM | POA: Diagnosis not present

## 2016-01-10 DIAGNOSIS — E785 Hyperlipidemia, unspecified: Secondary | ICD-10-CM

## 2016-01-10 NOTE — Progress Notes (Signed)
Cardiology Office Note Date:  01/10/2016  Patient ID:  [name], [name] December 21, 1945, MRN [id number] PCP:  Thornton Papas, PA-C  Cardiologist:  Dr. Mariah Milling, MD    Chief Complaint: Hospital follow up  History of Present Illness: [name] is a 70 y.o. male with history of CAD s/p PCI/DES to the LAD in 2011, chronic systolic CHF/ICM (previously on Entresto though unable to afford, now back on lisinopril), dietary noncompliance, hypertensive heart disease, HLD, morbid obesity, stroke, poorly controlled DM2, remote ETOH abuse, and dizziness who presents for hospital follow up from recent admission to [facility] from 7/25-7/26 for staged PCI s/p diagnostic cardiac cath on 12/29/15.    Patient previously underwent ischemic evaluation in 2013 with cardiac cath at that time showing patent LAD stent with moderate, nonobstructive more distal LAD disease with normal FFR. EF was noted to be 15% at that time. He had been medically managed since. He was seen in clinic in 02/2016. At that time, he was noting significant dizziness. Prior MRI suggested vertebrobailar disease and CTA was ordered to further evaluate. Unfortunately, the patient never followed up for imaging. Since 02/2016 his dizziness had improved. He has a very exertional job, working as a Marine scientist. He had noticed some slowing down around 05/2015 though was in his USOH until mid June with he noticed increased abdominal girth, DOE, weight gain, and orthopnea. He also noted rest and exertional chest pain. Because of these symptoms he presented to [facility] on 6/26. At that time his weight was noted to be 316 pounds (baseline around 300 pounds). Labs upon his arrival to [facility] showed a BNP of 97, CXR without acute findings, troponin normal, EKG with lateral TWI (old). He underwent successful IV diuresis with Lasix. Echo on 6/26 showed an EF of 20-25%, diffuse HK, GR1DD, moderately dilated LA, RV systolic function was normal, PASP could not be  estimated. It was initially recommended he undergo cardiac cath, though in follow up rounds this was put on hold by his primary cardiologist. Spironolactone was added prior to discharge. At hospital follow up on 12/28/15 he noted persistent SOB and fatigue. He stated his weight had been down trending at home with reading of 306 lbs on 7/16, 304 lbs on 7/17, and 302 lbs on 7/18 at home. His weight at the CHF clinic on 7/18 was 308 lbs and 309 in our office same day. He had not needed to take any prn metolazone. He was taking his medications as directed including Lasix 80 mg bid. He had significantly cut back on his PO fluid consumption and no longer used salt. He denied any increased orthopnea and no LE edema. He had not had any chest pain. He stated if he could breathe better he would be happy. He was scheduled for [facility] on 7/19 secondary to his symptoms which showed proximal LAD to mid LAD 30% stenosed with ISR, mid LAD 70%, mid RCA 75%, ramus 40%, normal RHC pressures indicating adequate diuresis and normal cardiac index. He did require 100 cc of contrast in this procedure and was noted to have a SCr of 1.7 at the time of the procedure. It was advised he hydrate some and come back in for staged procedure on 7/24 (though this was postponed 2/2 AC outage at [facility] that day). Repeat renal function prior to the staged procedure showed improved renal function at 1.08.   He underwent repeat cardiac cath on 7/26 that showed mid LAD 70% s/p 2 overlapping DES after  flow reserve evaluation showed an FFR ratio of 0.71. The RCA stenosis was not significant by FFR at 0.92. The procedure was overall difficult 2/2 diffuse calcified disease in the mid LAD segment. It was recommended he continue DAPT with aspirin and Plavix for at least 12 months and continue aggressive treatment of CAD and heart failure. Hypokalemia was repleted. Pravastatin was changed to Lipitor given an LDL of 122. He restarted metformin on 7/29.   Since his  cardiac cath, he has felt much better. He reports he is now able to be active with minimal fatigue until noon, then he takes a nap and is able to continue his day. Previously he could not do anything except lay around 2/2 SOB and fatigue. His breathing is much improved. He has not had any chest pain. Weight of 299 pounds this morning at home. BP at home has been in the 120-130 systolic range. He is tolerating all medications without issues. He has not missed any doses of aspirin or Plavix. He does not have any concerns today.    Past Medical History:  Diagnosis Date  . CAD (coronary artery disease)    a. 11/2009 Cath: LAD 90 (3.0x12 Xience DES);  b. 09/2011 LAD stent ok, 60d (nl FFR-->Med Rx); c. cardiac cath 12/29/15: p-mLAD 30% w/ ISR, mLAD 70%,, mRCA 75%, RI 40%, nl RH pressures, nl CI; d. staged PCI 01/05/16: mLAD s/p 2 overlapping DES,  mRCA not sig w/ FFR 0.92  . Cerebrovascular accident [facility])    a. No residual weakness  . Chronic systolic CHF (congestive heart failure) (HCC)    a. 05/2012 Echo: EF 15%, impaired LV relaxation. Mod concentric LVH. mod dil LA. Severe global HK.  . Dizziness    a. 01/2015 MRI: chronic left basal ganglia infarct, abnormal appearance of basialar and distal vertebral arteries suggestive of flow limiting stenoses, CTA recommended.  . Hyperlipidemia   . Hypertensive heart disease   . Ischemic cardiomyopathy    a. 05/2012 Echo: EF 15%.  . Morbid obesity (HCC)   . Type II diabetes mellitus (HCC)     Past Surgical History:  Procedure Laterality Date  . CARDIAC CATHETERIZATION  2013   showing 60% LAD disease after stent, no stent placed based on FFR pressure wire results  . CARDIAC CATHETERIZATION  2011   showing 90% LAD stenosis s/p stent placement  . CARDIAC CATHETERIZATION Bilateral 12/29/2015   Procedure: Right/Left Heart Cath and Coronary Angiography;  Surgeon: Marykay Lex, MD;  Location: [facility] INVASIVE CV LAB;  Service: Cardiovascular;  Laterality:  Bilateral;  . CARDIAC CATHETERIZATION Right 01/05/2016   Procedure: Coronary Stent Intervention;  Surgeon: Iran Ouch, MD;  Location: [facility] INVASIVE CV LAB;  Service: Cardiovascular;  Laterality: Right;  . CORONARY ANGIOPLASTY WITH STENT PLACEMENT    . KNEE CARTILAGE SURGERY     right  . KNEE SURGERY     left  . LASIK Bilateral 2015   eye sugeries  . TOTAL HIP ARTHROPLASTY Left 2014    Current Outpatient Prescriptions  Medication Sig Dispense Refill  . amLODipine (NORVASC) 10 MG tablet Take 1 tablet (10 mg total) by mouth daily. 90 tablet 3  . aspirin EC 81 MG tablet Take 81 mg by mouth every morning.    Marland Kitchen atorvastatin (LIPITOR) 40 MG tablet Take 1 tablet (40 mg total) by mouth daily at 6 PM. 90 tablet 3  . carvedilol (COREG) 25 MG tablet Take 1 tablet (25 mg total) by mouth 2 (two) times daily  with a meal. 180 tablet 3  . clopidogrel (PLAVIX) 75 MG tablet Take 1 tablet (75 mg total) by mouth daily. 90 tablet 3  . furosemide (LASIX) 80 MG tablet Take 1 tablet (80 mg total) by mouth 2 (two) times daily. 180 tablet 3  . lisinopril (PRINIVIL,ZESTRIL) 40 MG tablet Take 1 tablet (40 mg total) by mouth daily. 90 tablet 3  . metFORMIN (GLUCOPHAGE) 500 MG tablet Take 1 tablet (500 mg total) by mouth 2 (two) times daily with a meal. Restart metformin on 01/08/2016. 180 tablet 3  . metolazone (ZAROXOLYN) 2.5 MG tablet Take 1 tablet (2.5 mg total) by mouth daily as needed (fluid). 90 tablet 3  . potassium chloride (K-DUR) 10 MEQ tablet Take 1 tablet (10 mEq total) by mouth 2 (two) times daily as needed. 180 tablet 3  . spironolactone (ALDACTONE) 25 MG tablet Take 12.5 mg by mouth daily.     No current facility-administered medications for this visit.     Allergies:   Review of patient's allergies indicates no known allergies.   Social History:  The patient  reports that he has never smoked. He has never used smokeless tobacco. He reports that he does not drink alcohol or use drugs.   Family  History:  The patient's family history includes CAD in his brother, father, and sister.  ROS:   Review of Systems  Constitutional: Negative for chills, diaphoresis, fever, malaise/fatigue and weight loss.  HENT: Negative for congestion.   Eyes: Negative for discharge and redness.  Respiratory: Negative for cough, sputum production, shortness of breath and wheezing.   Cardiovascular: Negative for chest pain, palpitations, orthopnea, claudication, leg swelling and PND.  Gastrointestinal: Negative for abdominal pain, heartburn, nausea and vomiting.  Musculoskeletal: Negative for falls and myalgias.  Skin: Negative for rash.  Neurological: Negative for dizziness, tingling, tremors, sensory change, speech change, focal weakness, loss of consciousness and weakness.  Endo/Heme/Allergies: Does not bruise/bleed easily.  Psychiatric/Behavioral: Negative for substance abuse. The patient is not nervous/anxious.   All other systems reviewed and are negative.    PHYSICAL EXAM:  VS:  BP 102/64 (BP Location: Left Arm, Patient Position: Sitting, Cuff Size: Large)   Pulse 74   Ht  (1.956 m)   Wt (!) 304 lb 12.8 oz (138.3 kg)   BMI 36.14 kg/m  BMI: Body mass index is 36.14 kg/m.  Physical Exam  Constitutional: He is oriented to person, place, and time. He appears well-developed and well-nourished.  HENT:  Head: Normocephalic and atraumatic.  Eyes: Right eye exhibits no discharge. Left eye exhibits no discharge.  Neck: Normal range of motion. No JVD present.  Cardiovascular: Normal rate, regular rhythm, S1 normal, S2 normal and normal heart sounds.  Exam reveals no distant heart sounds, no friction rub, no midsystolic click and no opening snap.   No murmur heard. Cath site well healed without swelling, bleeding, bruising, erythema, or TTP. Pulse 2+.   Pulmonary/Chest: Effort normal and breath sounds normal. No respiratory distress. He has no decreased breath sounds. He has no wheezes. He has no  rales. He exhibits no tenderness.  Abdominal: Soft. He exhibits no distension. There is no tenderness.  Musculoskeletal: He exhibits no edema.  Neurological: He is alert and oriented to person, place, and time.  Skin: Skin is warm and dry. No cyanosis. Nails show no clubbing.  Psychiatric: He has a normal mood and affect. His speech is normal and behavior is normal. Judgment and thought content normal.  EKG:  Was ordered and interpreted by me today. Shows NSR, 74 bpm, right axis deviation, nonspecific IVCD, nonspecific lateral st/t changes  Recent Labs: 12/06/2015: B Natriuretic Peptide 97.0 01/06/2016: BUN 9; Creatinine, Ser 0.85; Hemoglobin 11.4; Platelets 160; Potassium 3.5; Sodium 139  01/06/2016: Cholesterol 173; HDL 24; LDL Cholesterol 122; Total CHOL/HDL Ratio 7.2; Triglycerides 134; VLDL 27   Estimated Creatinine Clearance: 128 mL/min (by C-G formula based on SCr of 0.85 mg/dL).   Wt Readings from Last 3 Encounters:  01/10/16 (!) 304 lb 12.8 oz (138.3 kg)  01/05/16 (!) 303 lb 5.7 oz (137.6 kg)  12/29/15 (!) 309 lb (140.2 kg)     Other studies reviewed: Additional studies/records reviewed today include: summarized above  ASSESSMENT AND PLAN:  1. CAD as above: No symptoms concerning for angina. Continue DAPT with ASA 81 mg and Plavix 75 mg daily for at least the next 12 months. On Coreg as below. No plans for further ischemic evaluation at this time.   2. Chronic systolic CHF/ICM: He is at his dry weight and feels much better s/p PCI as above. He does not appear to be volume overloaded. No further SOB. He is tolerating HF medications without issues. Continue Coreg, lisinopril, spironolactone. CHF education provided. He is now watching what he eats and limiting PO fluid consumption. Recheck echo in 3 months to evaluate EF. Given he is on a good HF medication regimen and is s/p PCI as above now if his EF remains below 25% in 3 months will refer to EP for evaluation of possible ICD.    3. SOB: Resolved. As above.   4. Hypertensive heart disease: BP is well controlled at home and in the office today. Continue current meds.   5. HLD: Lipitor. Recheck FLP and LFTs at next office visit.   6. Morbid obesity: Weight loss advised. Consider pulmonary rehab.   7. Fatigue/somnolence: Much improved. Has sleep study scheduled per CHF clinic.   Disposition: F/u with me in 3 months.    Current medicines are reviewed at length with the patient today.  The patient did not have any concerns regarding medicines.  Elinor Dodge PA-C 01/10/2016 2:08 PM     [facility] [place] [address] [place], [place] [address] [phone]

## 2016-01-10 NOTE — Patient Instructions (Addendum)
Medication Instructions:  Your physician recommends that you continue on your current medications as directed. Please refer to the Current Medication list given to you today.   Labwork: none  Testing/Procedures: Your physician has requested that you have an echocardiogram in 3 months. Echocardiography is a painless test that uses sound waves to create images of your heart. It provides your doctor with information about the size and shape of your heart and how well your heart's chambers and valves are working. This procedure takes approximately one hour. There are no restrictions for this procedure.    Follow-Up: Your physician recommends that you schedule a follow-up appointment in: 3 months with Eula Listen, PA-C   Any Other Special Instructions Will Be Listed Below (If Applicable).     If you need a refill on your cardiac medications before your next appointment, please call your pharmacy.  Echocardiogram An echocardiogram, or echocardiography, uses sound waves (ultrasound) to produce an image of your heart. The echocardiogram is simple, painless, obtained within a short period of time, and offers valuable information to your health care provider. The images from an echocardiogram can provide information such as:  Evidence of coronary artery disease (CAD).  Heart size.  Heart muscle function.  Heart valve function.  Aneurysm detection.  Evidence of a past heart attack.  Fluid buildup around the heart.  Heart muscle thickening.  Assess heart valve function. LET [facility] CARE PROVIDER KNOW ABOUT:  Any allergies you have.  All medicines you are taking, including vitamins, herbs, eye drops, creams, and over-the-counter medicines.  Previous problems you or members of your family have had with the use of anesthetics.  Any blood disorders you have.  Previous surgeries you have had.  Medical conditions you have.  Possibility of pregnancy, if this applies. BEFORE  THE PROCEDURE  No special preparation is needed. Eat and drink normally.  PROCEDURE   In order to produce an image of your heart, gel will be applied to your chest and a wand-like tool (transducer) will be moved over your chest. The gel will help transmit the sound waves from the transducer. The sound waves will harmlessly bounce off your heart to allow the heart images to be captured in real-time motion. These images will then be recorded.  You may need an IV to receive a medicine that improves the quality of the pictures. AFTER THE PROCEDURE You may return to your normal schedule including diet, activities, and medicines, unless your health care provider tells you otherwise.   This information is not intended to replace advice given to you by your health care provider. Make sure you discuss any questions you have with your health care provider.   Document Released: 05/26/2000 Document Revised: 06/19/2014 Document Reviewed: 02/03/2013 Elsevier Interactive Patient Education [employer]

## 2016-01-12 LAB — GLUCOSE, CAPILLARY: Glucose-Capillary: 148 mg/dL — ABNORMAL HIGH (ref 65–99)

## 2016-01-18 NOTE — Telephone Encounter (Signed)
Thomas Ibaimothy J Gollan, MD [name], CMA  Medication Detail    Disp Refills Start End   potassium chloride (K-DUR) 10 MEQ tablet 180 tablet 3 12/31/2015    Sig - Route: Take 1 tablet (10 mEq total) by mouth 2 (two) times daily as needed. - Oral   E-Prescribing Status: Receipt confirmed by pharmacy (12/31/2015 10:17 AM EDT)

## 2016-01-26 ENCOUNTER — Inpatient Hospital Stay
Admission: EM | Admit: 2016-01-26 | Discharge: 2016-01-28 | DRG: 389 | Disposition: A | Discharge status: Home / Self Care | Payer: Medicare Other | Attending: Internal Medicine | Admitting: Internal Medicine

## 2016-01-26 ENCOUNTER — Emergency Department: Payer: Medicare Other

## 2016-01-26 ENCOUNTER — Telehealth: Payer: Self-pay | Admitting: Cardiovascular Disease

## 2016-01-26 ENCOUNTER — Encounter: Payer: Self-pay | Admitting: Emergency Medicine

## 2016-01-26 ENCOUNTER — Ambulatory Visit: Payer: Medicare Other | Admitting: Family

## 2016-01-26 DIAGNOSIS — Z955 Presence of coronary angioplasty implant and graft: Secondary | ICD-10-CM | POA: Diagnosis not present

## 2016-01-26 DIAGNOSIS — R112 Nausea with vomiting, unspecified: Secondary | ICD-10-CM

## 2016-01-26 DIAGNOSIS — Z8249 Family history of ischemic heart disease and other diseases of the circulatory system: Secondary | ICD-10-CM | POA: Diagnosis not present

## 2016-01-26 DIAGNOSIS — I2510 Atherosclerotic heart disease of native coronary artery without angina pectoris: Secondary | ICD-10-CM | POA: Diagnosis present

## 2016-01-26 DIAGNOSIS — Z96642 Presence of left artificial hip joint: Secondary | ICD-10-CM | POA: Diagnosis present

## 2016-01-26 DIAGNOSIS — Z7902 Long term (current) use of antithrombotics/antiplatelets: Secondary | ICD-10-CM

## 2016-01-26 DIAGNOSIS — K5900 Constipation, unspecified: Secondary | ICD-10-CM | POA: Diagnosis present

## 2016-01-26 DIAGNOSIS — K567 Ileus, unspecified: Principal | ICD-10-CM | POA: Diagnosis present

## 2016-01-26 DIAGNOSIS — Z6835 Body mass index (BMI) 35.0-35.9, adult: Secondary | ICD-10-CM | POA: Diagnosis not present

## 2016-01-26 DIAGNOSIS — E119 Type 2 diabetes mellitus without complications: Secondary | ICD-10-CM | POA: Diagnosis present

## 2016-01-26 DIAGNOSIS — I255 Ischemic cardiomyopathy: Secondary | ICD-10-CM | POA: Diagnosis present

## 2016-01-26 DIAGNOSIS — E785 Hyperlipidemia, unspecified: Secondary | ICD-10-CM | POA: Diagnosis present

## 2016-01-26 DIAGNOSIS — R109 Unspecified abdominal pain: Secondary | ICD-10-CM

## 2016-01-26 DIAGNOSIS — I5022 Chronic systolic (congestive) heart failure: Secondary | ICD-10-CM | POA: Diagnosis present

## 2016-01-26 DIAGNOSIS — Z7982 Long term (current) use of aspirin: Secondary | ICD-10-CM

## 2016-01-26 DIAGNOSIS — I110 Hypertensive heart disease with heart failure: Secondary | ICD-10-CM | POA: Diagnosis present

## 2016-01-26 LAB — COMPREHENSIVE METABOLIC PANEL
ALK PHOS: 71 U/L (ref 38–126)
ALT: 18 U/L (ref 17–63)
AST: 27 U/L (ref 15–41)
Albumin: 4.4 g/dL (ref 3.5–5.0)
Anion gap: 10 (ref 5–15)
BILIRUBIN TOTAL: 0.8 mg/dL (ref 0.3–1.2)
BUN: 30 mg/dL — AB (ref 6–20)
CALCIUM: 9.1 mg/dL (ref 8.9–10.3)
CO2: 29 mmol/L (ref 22–32)
CREATININE: 1.24 mg/dL (ref 0.61–1.24)
Chloride: 96 mmol/L — ABNORMAL LOW (ref 101–111)
GFR, EST NON AFRICAN AMERICAN: 58 mL/min — AB (ref >60)
Glucose, Bld: 275 mg/dL — ABNORMAL HIGH (ref 65–99)
Potassium: 3.8 mmol/L (ref 3.5–5.1)
Sodium: 135 mmol/L (ref 135–145)
TOTAL PROTEIN: 7.8 g/dL (ref 6.5–8.1)

## 2016-01-26 LAB — LIPASE, BLOOD: LIPASE: 36 U/L (ref 11–51)

## 2016-01-26 LAB — CBC
HCT: 38.5 % — ABNORMAL LOW (ref 40.0–52.0)
Hemoglobin: 13.3 g/dL (ref 13.0–18.0)
MCH: 29.6 pg (ref 26.0–34.0)
MCHC: 34.5 g/dL (ref 32.0–36.0)
MCV: 85.8 fL (ref 80.0–100.0)
PLATELETS: 200 10*3/uL (ref 150–440)
RBC: 4.48 MIL/uL (ref 4.40–5.90)
RDW: 13.9 % (ref 11.5–14.5)
WBC: 10.6 10*3/uL (ref 3.8–10.6)

## 2016-01-26 LAB — URINALYSIS COMPLETE WITH MICROSCOPIC (ARMC ONLY)
BILIRUBIN URINE: NEGATIVE
Bacteria, UA: NONE SEEN
GLUCOSE, UA: 100 mg/dL — AB
HGB URINE DIPSTICK: NEGATIVE
KETONES UR: NEGATIVE mg/dL
LEUKOCYTES UA: NEGATIVE
Nitrite: NEGATIVE
PH: 5 (ref 5.0–8.0)
Protein, ur: 100 mg/dL — AB
Specific Gravity, Urine: 1.025 (ref 1.005–1.030)

## 2016-01-26 LAB — GLUCOSE, CAPILLARY
Glucose-Capillary: 215 mg/dL — ABNORMAL HIGH (ref 65–99)
Glucose-Capillary: 201 mg/dL — ABNORMAL HIGH (ref 65–99)

## 2016-01-26 LAB — TROPONIN I: Troponin I: <0.03 ng/mL (ref <0.03)

## 2016-01-26 LAB — TSH: TSH: 0.749 u[IU]/mL (ref 0.350–4.500)

## 2016-01-26 MED ORDER — ALBUTEROL SULFATE (2.5 MG/3ML) 0.083% IN NEBU: 2.5000 mg | INHALATION_SOLUTION | RESPIRATORY_TRACT | Status: DC | PRN

## 2016-01-26 MED ORDER — LISINOPRIL 20 MG PO TABS
40.0000 mg | ORAL_TABLET | Freq: Every day | ORAL | Status: DC
  Administered 2016-01-27 – 2016-01-28 (×2): 40 mg via ORAL
  Filled 2016-01-26 (×2): qty 2

## 2016-01-26 MED ORDER — FLEET ENEMA 7-19 GM/118ML RE ENEM
1.0000 | ENEMA | Freq: Once | RECTAL | Status: AC | PRN
  Administered 2016-01-27: 1 via RECTAL

## 2016-01-26 MED ORDER — POLYETHYLENE GLYCOL 3350 17 G PO PACK: 17.0000 g | PACK | Freq: Every day | ORAL | Status: DC | PRN

## 2016-01-26 MED ORDER — DOCUSATE SODIUM 100 MG PO CAPS
100.0000 mg | ORAL_CAPSULE | Freq: Two times a day (BID) | ORAL | Status: DC
  Administered 2016-01-26 – 2016-01-28 (×4): 100 mg via ORAL
  Filled 2016-01-26 (×4): qty 1

## 2016-01-26 MED ORDER — FENTANYL CITRATE (PF) 100 MCG/2ML IJ SOLN
50.0000 ug | Freq: Once | INTRAMUSCULAR | Status: AC
  Administered 2016-01-26: 50 ug via INTRAVENOUS
  Filled 2016-01-26: qty 2

## 2016-01-26 MED ORDER — POTASSIUM CHLORIDE IN NACL 20-0.9 MEQ/L-% IV SOLN
INTRAVENOUS | Status: AC
  Administered 2016-01-26 – 2016-01-27 (×2): via INTRAVENOUS
  Filled 2016-01-26 (×3): qty 1000

## 2016-01-26 MED ORDER — ACETAMINOPHEN 650 MG RE SUPP: 650.0000 mg | Freq: Four times a day (QID) | RECTAL | Status: DC | PRN

## 2016-01-26 MED ORDER — IOPAMIDOL (ISOVUE-300) INJECTION 61%
100.0000 mL | Freq: Once | INTRAVENOUS | Status: AC | PRN
  Administered 2016-01-26: 100 mL via INTRAVENOUS

## 2016-01-26 MED ORDER — ATORVASTATIN CALCIUM 20 MG PO TABS
40.0000 mg | ORAL_TABLET | Freq: Every day | ORAL | Status: DC
  Administered 2016-01-27: 40 mg via ORAL
  Filled 2016-01-26: qty 2

## 2016-01-26 MED ORDER — GI COCKTAIL ~~LOC~~
30.0000 mL | Freq: Once | ORAL | Status: AC
  Administered 2016-01-26: 30 mL via ORAL
  Filled 2016-01-26: qty 30

## 2016-01-26 MED ORDER — DIATRIZOATE MEGLUMINE & SODIUM 66-10 % PO SOLN
15.0000 mL | Freq: Once | ORAL | Status: AC
  Administered 2016-01-26: 15 mL via ORAL

## 2016-01-26 MED ORDER — BISACODYL 10 MG RE SUPP: 10.0000 mg | Freq: Every day | RECTAL | Status: DC | PRN

## 2016-01-26 MED ORDER — ONDANSETRON HCL 4 MG/2ML IJ SOLN
INTRAMUSCULAR | Status: AC
  Administered 2016-01-26: 4 mg via INTRAVENOUS
  Filled 2016-01-26: qty 2

## 2016-01-26 MED ORDER — ONDANSETRON HCL 4 MG/2ML IJ SOLN
4.0000 mg | Freq: Once | INTRAMUSCULAR | Status: AC
  Administered 2016-01-26: 4 mg via INTRAVENOUS

## 2016-01-26 MED ORDER — CARVEDILOL 25 MG PO TABS
25.0000 mg | ORAL_TABLET | Freq: Two times a day (BID) | ORAL | Status: DC
  Administered 2016-01-26 – 2016-01-28 (×4): 25 mg via ORAL
  Filled 2016-01-26 (×4): qty 1

## 2016-01-26 MED ORDER — SODIUM CHLORIDE 0.9 % IV BOLUS (SEPSIS)
500.0000 mL | Freq: Once | INTRAVENOUS | Status: AC
  Administered 2016-01-26: 500 mL via INTRAVENOUS

## 2016-01-26 MED ORDER — CLOPIDOGREL BISULFATE 75 MG PO TABS
75.0000 mg | ORAL_TABLET | Freq: Every day | ORAL | Status: DC
  Administered 2016-01-27 – 2016-01-28 (×2): 75 mg via ORAL
  Filled 2016-01-26 (×2): qty 1

## 2016-01-26 MED ORDER — SPIRONOLACTONE 25 MG PO TABS
12.5000 mg | ORAL_TABLET | Freq: Every day | ORAL | Status: DC
  Administered 2016-01-27 – 2016-01-28 (×2): 12.5 mg via ORAL
  Filled 2016-01-26 (×2): qty 1

## 2016-01-26 MED ORDER — ONDANSETRON HCL 4 MG/2ML IJ SOLN
4.0000 mg | Freq: Four times a day (QID) | INTRAMUSCULAR | Status: DC | PRN
  Administered 2016-01-26 – 2016-01-27 (×3): 4 mg via INTRAVENOUS
  Filled 2016-01-26 (×3): qty 2

## 2016-01-26 MED ORDER — ENOXAPARIN SODIUM 40 MG/0.4ML ~~LOC~~ SOLN
40.0000 mg | SUBCUTANEOUS | Status: DC
  Administered 2016-01-26 – 2016-01-27 (×2): 40 mg via SUBCUTANEOUS
  Filled 2016-01-26 (×2): qty 0.4

## 2016-01-26 MED ORDER — SODIUM CHLORIDE 0.9% FLUSH
3.0000 mL | Freq: Two times a day (BID) | INTRAVENOUS | Status: DC
  Administered 2016-01-27 – 2016-01-28 (×2): 3 mL via INTRAVENOUS

## 2016-01-26 MED ORDER — SENNA 8.6 MG PO TABS
1.0000 | ORAL_TABLET | Freq: Two times a day (BID) | ORAL | Status: DC
  Administered 2016-01-26 – 2016-01-28 (×4): 8.6 mg via ORAL
  Filled 2016-01-26 (×5): qty 1

## 2016-01-26 MED ORDER — BISACODYL 10 MG RE SUPP
10.0000 mg | Freq: Once | RECTAL | Status: AC
  Administered 2016-01-26: 10 mg via RECTAL
  Filled 2016-01-26: qty 1

## 2016-01-26 MED ORDER — ACETAMINOPHEN 325 MG PO TABS: 650.0000 mg | ORAL_TABLET | Freq: Four times a day (QID) | ORAL | Status: DC | PRN

## 2016-01-26 MED ORDER — ASPIRIN EC 81 MG PO TBEC
81.0000 mg | DELAYED_RELEASE_TABLET | ORAL | Status: DC
  Administered 2016-01-27 – 2016-01-28 (×2): 81 mg via ORAL
  Filled 2016-01-26 (×2): qty 1

## 2016-01-26 MED ORDER — ONDANSETRON HCL 4 MG PO TABS: 4.0000 mg | ORAL_TABLET | Freq: Four times a day (QID) | ORAL | Status: DC | PRN

## 2016-01-26 MED ORDER — MORPHINE SULFATE (PF) 2 MG/ML IV SOLN
2.0000 mg | Freq: Four times a day (QID) | INTRAVENOUS | Status: DC | PRN
  Administered 2016-01-26 – 2016-01-27 (×4): 2 mg via INTRAVENOUS
  Filled 2016-01-26 (×4): qty 1

## 2016-01-26 MED ORDER — INSULIN ASPART 100 UNIT/ML ~~LOC~~ SOLN
0.0000 [IU] | SUBCUTANEOUS | Status: DC
  Administered 2016-01-26: 5 [IU] via SUBCUTANEOUS
  Administered 2016-01-27 (×3): 3 [IU] via SUBCUTANEOUS
  Administered 2016-01-27 (×3): 2 [IU] via SUBCUTANEOUS
  Administered 2016-01-27 – 2016-01-28 (×2): 3 [IU] via SUBCUTANEOUS
  Administered 2016-01-28: 5 [IU] via SUBCUTANEOUS
  Administered 2016-01-28: 3 [IU] via SUBCUTANEOUS
  Filled 2016-01-26: qty 3
  Filled 2016-01-26: qty 2
  Filled 2016-01-26 (×4): qty 3
  Filled 2016-01-26: qty 2
  Filled 2016-01-26: qty 5
  Filled 2016-01-26: qty 3
  Filled 2016-01-26: qty 2
  Filled 2016-01-26: qty 5

## 2016-01-26 NOTE — ED Triage Notes (Signed)
Pt to ed with c/o vomiting since Sunday night.  Per family pt started having dark colored vomit. Pt sent from dr Ethelene Halgollans office.

## 2016-01-26 NOTE — Telephone Encounter (Signed)
Patient called in and reports that he started throwing up Sunday night and it is black and looks like coffee. He states that his chest is on fire and he has not been able to hold anything down. Instructed him to go to emergency room to be evaluated. He verbalized understanding of our conversation, agreement with plan of care, and had no further questions. Notified Baird Lyonsasey RN ED charge nurse that patient would be coming in from home.

## 2016-01-26 NOTE — H&P (Signed)
[facility] [place] at [facility]   PATIENT NAME: Thomas FillersGary Bender    MR#:  [id number]  DATE OF BIRTH:  04/17/1946  DATE OF ADMISSION:  01/26/2016  PRIMARY CARE PHYSICIAN: Thornton PapasSykes, Larry Justain, PA-C   REQUESTING/REFERRING PHYSICIAN: Dr. Alphonzo LemmingsMcShane  CHIEF COMPLAINT:   Chief Complaint  Patient presents with  . Emesis    HISTORY OF PRESENT ILLNESS:  [name]  is a 70 y.o. male with a known history of CAD with recent LAD DES, HTN, DM , Systolic chf with EF 25% here with vomiting and epigastric pain with burning chest on vomiting. He has been constipated and since Sunday he hasnt been able to keep any meds or liquids down. Decreased urine output. No melena.  Vomiting checked for blood and negative in ED  PAST MEDICAL HISTORY:   Past Medical History:  Diagnosis Date  . CAD (coronary artery disease)    a. 11/2009 Cath: LAD 90 (3.0x12 Xience DES);  b. 09/2011 LAD stent ok, 60d (nl FFR-->Med Rx); c. cardiac cath 12/29/15: p-mLAD 30% w/ ISR, mLAD 70%,, mRCA 75%, RI 40%, nl RH pressures, nl CI; d. staged PCI 01/05/16: mLAD s/p 2 overlapping DES,  mRCA not sig w/ FFR 0.92  . Cerebrovascular accident [facility](HCC)    a. No residual weakness  . Chronic systolic CHF (congestive heart failure) (HCC)    a. 05/2012 Echo: EF 15%, impaired LV relaxation. Mod concentric LVH. mod dil LA. Severe global HK.  . Dizziness    a. 01/2015 MRI: chronic left basal ganglia infarct, abnormal appearance of basialar and distal vertebral arteries suggestive of flow limiting stenoses, CTA recommended.  . Hyperlipidemia   . Hypertension   . Hypertensive heart disease   . Ischemic cardiomyopathy    a. 05/2012 Echo: EF 15%.  . Morbid obesity (HCC)   . Type II diabetes mellitus (HCC)     PAST SURGICAL HISTORY:   Past Surgical History:  Procedure Laterality Date  . CARDIAC CATHETERIZATION  2013   showing 60% LAD disease after stent, no stent placed based on FFR pressure wire results  .  CARDIAC CATHETERIZATION  2011   showing 90% LAD stenosis s/p stent placement  . CARDIAC CATHETERIZATION Bilateral 12/29/2015   Procedure: Right/Left Heart Cath and Coronary Angiography;  Surgeon: Marykay Lexavid W Harding, MD;  [facility]VAS[facility] CV LAB;  Service: Cardiovascular;  Laterality: Bilateral;  . CARDIAC CATHETERIZATION Right 01/05/2016   Procedure: Coronary Stent Intervention;  Surgeon: Iran OuchMuhammad A Arida, MD;  Location: [facility] INVASIVE CV LAB;  Service: Cardiovascular;  Laterality: Right;  . CORONARY ANGIOPLASTY WITH STENT PLACEMENT    . KNEE CARTILAGE SURGERY     right  . KNEE SURGERY     left  . LASIK Bilateral 2015   eye sugeries  . TOTAL HIP ARTHROPLASTY Left 2014    SOCIAL HISTORY:   Social History  Substance Use Topics  . Smoking status: Never Smoker  . Smokeless tobacco: Never Used  . Alcohol use 3.6 oz/week    6 Cans of beer per week    FAMILY HISTORY:   Family History  Problem Relation Age of Onset  . CAD Father     died @ 1360 of MI - CAD dx in his 1750's.  Marland Kitchen. CAD Brother     several brothers died of MI  . CAD Sister     died of MI.    DRUG ALLERGIES:  No Known Allergies  REVIEW OF SYSTEMS:   Review of Systems  Constitutional: Positive for malaise/fatigue. Negative for chills, fever and weight loss.  HENT: Negative for hearing loss and nosebleeds.   Eyes: Negative for blurred vision, double vision and pain.  Respiratory: Negative for cough, hemoptysis, sputum production, shortness of breath and wheezing.   Cardiovascular: Negative for chest pain, palpitations, orthopnea and leg swelling.  Gastrointestinal: Positive for abdominal pain, constipation, nausea and vomiting. Negative for diarrhea.  Genitourinary: Negative for dysuria and hematuria.  Musculoskeletal: Negative for back pain, falls and myalgias.  Skin: Negative for rash.  Neurological: Positive for weakness. Negative for dizziness, tremors, sensory change, speech change, focal weakness, seizures and  headaches.  Endo/Heme/Allergies: Does not bruise/bleed easily.  Psychiatric/Behavioral: Negative for depression and memory loss. The patient is not nervous/anxious.     MEDICATIONS AT HOME:   Prior to Admission medications   Medication Sig Start Date End Date Taking? Authorizing Provider  amLODipine (NORVASC) 10 MG tablet Take 1 tablet (10 mg total) by mouth daily. 03/16/14   Antonieta Iba, MD  aspirin EC 81 MG tablet Take 81 mg by mouth every morning.    Historical Provider, MD  atorvastatin (LIPITOR) 40 MG tablet Take 1 tablet (40 mg total) by mouth daily at 6 PM. 01/06/16   Sondra Barges, PA-C  carvedilol (COREG) 25 MG tablet Take 1 tablet (25 mg total) by mouth 2 (two) times daily with a meal. 01/06/16   Sondra Barges, PA-C  clopidogrel (PLAVIX) 75 MG tablet Take 1 tablet (75 mg total) by mouth daily. 01/06/16   Sondra Barges, PA-C  furosemide (LASIX) 80 MG tablet Take 1 tablet (80 mg total) by mouth 2 (two) times daily. 01/06/16   Raymon Mutton Dunn, PA-C  lisinopril (PRINIVIL,ZESTRIL) 40 MG tablet Take 1 tablet (40 mg total) by mouth daily. 01/06/16   Sondra Barges, PA-C  metFORMIN (GLUCOPHAGE) 500 MG tablet Take 1 tablet (500 mg total) by mouth 2 (two) times daily with a meal. Restart metformin on 01/08/2016. 01/06/16   Sondra Barges, PA-C  metolazone (ZAROXOLYN) 2.5 MG tablet Take 1 tablet (2.5 mg total) by mouth daily as needed (fluid). 01/06/16   Raymon Mutton Dunn, PA-C  potassium chloride (K-DUR) 10 MEQ tablet Take 1 tablet (10 mEq total) by mouth 2 (two) times daily as needed. 12/31/15   Antonieta Iba, MD  spironolactone (ALDACTONE) 25 MG tablet Take 12.5 mg by mouth daily. 12/09/15   Historical Provider, MD     VITAL SIGNS:  Blood pressure (!) 158/94, pulse 81, temperature 97.7 F (36.5 C), temperature source Oral, resp. rate (!) 22, height 6\' 5"  (1.956 m), weight (!) 137 kg (302 lb), SpO2 94 %.  PHYSICAL EXAMINATION:  Physical Exam  GENERAL:  70 y.o.-year-old patient lying in the bed with no acute  distress.  EYES: Pupils equal, round, reactive to light and accommodation. No scleral icterus. Extraocular muscles intact.  HEENT: Head atraumatic, normocephalic. Oropharynx and nasopharynx clear. No oropharyngeal erythema, moist oral mucosa  NECK:  Supple, no jugular venous distention. No thyroid enlargement, no tenderness.  LUNGS: Normal breath sounds bilaterally, no wheezing, rales, rhonchi. No use of accessory muscles of respiration.  CARDIOVASCULAR: S1, S2 normal. No murmurs, rubs, or gallops.  ABDOMEN: Soft, Epigastric tender, distended. Bowel sounds decreased. No organomegaly or mass.  EXTREMITIES: No pedal edema, cyanosis, or clubbing. + 2 pedal & radial pulses b/l.   NEUROLOGIC: Cranial nerves II through XII are intact. No focal Motor or sensory deficits appreciated b/l PSYCHIATRIC: The patient is alert and  oriented x 3. Good affect.  SKIN: No obvious rash, lesion, or ulcer.   LABORATORY PANEL:   CBC  Recent Labs Lab 01/26/16 1113  WBC 10.6  HGB 13.3  HCT 38.5*  PLT 200   ------------------------------------------------------------------------------------------------------------------  Chemistries   Recent Labs Lab 01/26/16 1113  NA 135  K 3.8  CL 96*  CO2 29  GLUCOSE 275*  BUN 30*  CREATININE 1.24  CALCIUM 9.1  AST 27  ALT 18  ALKPHOS 71  BILITOT 0.8   ------------------------------------------------------------------------------------------------------------------  Cardiac Enzymes  Recent Labs Lab 01/26/16 1113  TROPONINI <0.03   ------------------------------------------------------------------------------------------------------------------  RADIOLOGY:  Ct Abdomen Pelvis W Contrast  Result Date: 01/26/2016 CLINICAL DATA:  Vomiting and upper abdominal pain for several days, initial encounter EXAM: CT ABDOMEN AND PELVIS WITH CONTRAST TECHNIQUE: Multidetector CT imaging of the abdomen and pelvis was performed using the standard protocol following  bolus administration of intravenous contrast. CONTRAST:  ISOVUE-300 IOPAMIDOL (ISOVUE-300) INJECTION 61% COMPARISON:  None. FINDINGS: Lower chest:  No acute findings. Hepatobiliary: No masses or other significant abnormality. Pancreas: No mass, inflammatory changes, or other significant abnormality. Spleen: Within normal limits in size and appearance. Adrenals/Urinary Tract: Small hypodensities likely representing cysts Are noted bilaterally measuring 1 cm less with the exception of an exophytic cyst arising from the posterior medial aspect of the right kidney which measures 2 cm. No calculi or obstructive changes are noted. The bladder is well distended. Stomach/Bowel: The stomach is distended with fluid. Proximal small bowel dilatation is noted. This extends into the proximal ileum and a relative transition zone is noted in the right mid abdomen on image number 71 of series 2 although no lesion is seen. The distal ileum is within normal limits. The colon is unremarkable. The appendix is within normal limits. Vascular/Lymphatic: No pathologically enlarged lymph nodes. No evidence of abdominal aortic aneurysm. Reproductive: No mass or other significant abnormality. Other: Minimal amount of free pelvic fluid is noted of uncertain significance but may be related to the small bowel dilatation. No free air is seen. Musculoskeletal: No acute bony abnormality is noted. Left hip replacement is seen. IMPRESSION: Mild small bowel dilatation within the jejunum and proximal ileum without focal obstructing mass lesion. This may represent some mild enteritis or small-bowel ileus correlation with the physical exam is recommended. Scattered renal hypodensities likely representing cysts. No other significant abnormality is noted. Electronically Signed   By: Alcide Clever M.D.   On: 01/26/2016 14:01   Dg Chest Port 1 View  Result Date: 01/26/2016 CLINICAL DATA:  Shortness of breath with vomiting EXAM: PORTABLE CHEST 1 VIEW  COMPARISON:  December 07, 2015 FINDINGS: There is stable eventration of the right hemidiaphragm. There is atelectasis in the right lower lobe. The lungs elsewhere are clear. Heart is mildly enlarged with pulmonary vascular within normal limits. No adenopathy. No bone lesions. IMPRESSION: Right lower lobe atelectasis. No edema or consolidation. Stable cardiac prominence. Stable eventration of the right hemidiaphragm. Electronically Signed   By: Bretta Bang III M.D.   On: 01/26/2016 12:55     IMPRESSION AND PLAN:   * Ileus likely due to constipation NPO. Dulcolax suppository. Add colace and senna. NGT if he continues have vomiting. Ct abd showed obstruction.  * CAD with recent DES Continue ASA and Plavix  * HTN Home meds IV PRN meds.  * DM SSI. Q4 accuchecks while NPO.  * DVT prophylaxis Lovenox  All the records are reviewed and case discussed with ED provider. Management plans discussed  with the patient, family and they are in agreement.  CODE STATUS: FULL CODE  TOTAL TIME TAKING CARE OF THIS PATIENT: 40 minutes.   Milagros LollSudini, Aspen Deterding R M.D on 01/26/2016 at 3:24 PM  Between 7am to 6pm - Pager - [phone]  After 6pm go to [email or web address] - password [email or web address] [facility]  [facility]  Office  [phone]  CC: Primary care physician; Thornton PapasSykes, Larry Justain, PA-C  Note: This dictation was prepared with Dragon dictation along with smaller phrase technology. Any transcriptional errors that result from this process are unintentional.

## 2016-01-26 NOTE — Telephone Encounter (Signed)
Pt spouse calling stating pt has been throwing up since Saturday  And last night when he was, black stuff came out as well Not sure if patient needs pcp or urgent care. Please advise

## 2016-01-27 ENCOUNTER — Inpatient Hospital Stay: Payer: Medicare Other

## 2016-01-27 LAB — CBC
HEMATOCRIT: 33.2 % — AB (ref 40.0–52.0)
Hemoglobin: 11.6 g/dL — ABNORMAL LOW (ref 13.0–18.0)
MCH: 29.9 pg (ref 26.0–34.0)
MCHC: 34.9 g/dL (ref 32.0–36.0)
MCV: 85.5 fL (ref 80.0–100.0)
PLATELETS: 155 10*3/uL (ref 150–440)
RBC: 3.88 MIL/uL — ABNORMAL LOW (ref 4.40–5.90)
RDW: 13.9 % (ref 11.5–14.5)
WBC: 7 10*3/uL (ref 3.8–10.6)

## 2016-01-27 LAB — BASIC METABOLIC PANEL
Anion gap: 7 (ref 5–15)
BUN: 29 mg/dL — AB (ref 6–20)
CHLORIDE: 97 mmol/L — AB (ref 101–111)
CO2: 31 mmol/L (ref 22–32)
CREATININE: 0.99 mg/dL (ref 0.61–1.24)
Calcium: 8.1 mg/dL — ABNORMAL LOW (ref 8.9–10.3)
GFR calc Af Amer: >60 mL/min (ref >60)
GFR calc non Af Amer: >60 mL/min (ref >60)
GLUCOSE: 160 mg/dL — AB (ref 65–99)
POTASSIUM: 3.5 mmol/L (ref 3.5–5.1)
Sodium: 135 mmol/L (ref 135–145)

## 2016-01-27 LAB — GLUCOSE, CAPILLARY
GLUCOSE-CAPILLARY: 174 mg/dL — AB (ref 65–99)
GLUCOSE-CAPILLARY: 155 mg/dL — AB (ref 65–99)
GLUCOSE-CAPILLARY: 148 mg/dL — AB (ref 65–99)
Glucose-Capillary: 139 mg/dL — ABNORMAL HIGH (ref 65–99)
Glucose-Capillary: 144 mg/dL — ABNORMAL HIGH (ref 65–99)
Glucose-Capillary: 172 mg/dL — ABNORMAL HIGH (ref 65–99)
Glucose-Capillary: 174 mg/dL — ABNORMAL HIGH (ref 65–99)

## 2016-01-27 MED ORDER — SODIUM CHLORIDE 0.9 % IJ SOLN
INTRAMUSCULAR | Status: AC
  Administered 2016-01-27: 10 mL
  Filled 2016-01-27: qty 10

## 2016-01-27 MED ORDER — ONDANSETRON HCL 4 MG PO TABS: 4.0000 mg | ORAL_TABLET | ORAL | Status: DC | PRN

## 2016-01-27 MED ORDER — ONDANSETRON HCL 4 MG/2ML IJ SOLN
4.0000 mg | INTRAMUSCULAR | Status: DC | PRN
  Administered 2016-01-27 – 2016-01-28 (×4): 4 mg via INTRAVENOUS
  Filled 2016-01-27 (×4): qty 2

## 2016-01-27 MED ORDER — MENTHOL 3 MG MT LOZG
1.0000 | LOZENGE | OROMUCOSAL | Status: DC | PRN
  Filled 2016-01-27: qty 9

## 2016-01-27 MED ORDER — BISACODYL 10 MG RE SUPP
10.0000 mg | Freq: Every day | RECTAL | Status: DC
  Administered 2016-01-27: 10:00:00 10 mg via RECTAL
  Filled 2016-01-27: qty 1

## 2016-01-27 MED ORDER — LACTULOSE 10 GM/15ML PO SOLN
30.0000 g | Freq: Three times a day (TID) | ORAL | Status: DC
  Administered 2016-01-27 – 2016-01-28 (×4): 30 g via ORAL
  Filled 2016-01-27 (×4): qty 60

## 2016-01-27 MED ORDER — PROMETHAZINE HCL 25 MG/ML IJ SOLN
12.5000 mg | Freq: Once | INTRAMUSCULAR | Status: AC
  Administered 2016-01-27: 21:00:00 12.5 mg via INTRAVENOUS
  Filled 2016-01-27: qty 1

## 2016-01-27 MED ORDER — PROMETHAZINE HCL 25 MG/ML IJ SOLN
12.5000 mg | Freq: Once | INTRAMUSCULAR | Status: AC
  Administered 2016-01-27: 05:00:00 12.5 mg via INTRAVENOUS
  Filled 2016-01-27: qty 1

## 2016-01-27 MED ORDER — LACTULOSE 10 GM/15ML PO SOLN: 30.0000 g | Freq: Four times a day (QID) | ORAL | Status: DC

## 2016-01-27 MED ORDER — TRAMADOL HCL 50 MG PO TABS
50.0000 mg | ORAL_TABLET | Freq: Three times a day (TID) | ORAL | Status: DC | PRN
  Administered 2016-01-27 (×2): 50 mg via ORAL
  Filled 2016-01-27 (×2): qty 1

## 2016-01-27 NOTE — Care Management (Signed)
Admitted to [facility] with the diagnosis of ileus. Lives alone. [facility] seen [facility] 8-10 months ago. Sister is Ethelda Chickeresa Wood [phone]). [facility]. No skilled facility. No home oxygen.Uses no aids for ambulation. Takes care of all basic and instrumental activities of daily living himself, drives. Two falls in the last  6 months. Uses no aids for ambulation. [facility] [place]. Family will transport. Gwenette GreetBrenda S Nickia Boesen RN MSN CCM Care Management  [phone]

## 2016-01-27 NOTE — Progress Notes (Signed)
Initial Nutrition Assessment  DOCUMENTATION CODES:   Obesity unspecified  INTERVENTION:  -Monitor intake and diet progression. Recommend soft diet once able to tolerate solid foods -Pt familiar with diabetic, low sodium diet restrictions. No further education needed.     NUTRITION DIAGNOSIS:   Inadequate oral intake related to altered GI function as evidenced by per patient/family report.    GOAL:   Patient will meet greater than or equal to 90% of their needs    MONITOR:   PO intake, Diet advancement, Weight trends  REASON FOR ASSESSMENT:   Malnutrition Screening Tool    ASSESSMENT:      Pt admitted with constipation, ileus, emesis.  Past Medical History:  Diagnosis Date  . CAD (coronary artery disease)    a. 11/2009 Cath: LAD 90 (3.0x12 Xience DES);  b. 09/2011 LAD stent ok, 60d (nl FFR-->Med Rx); c. cardiac cath 12/29/15: p-mLAD 30% w/ ISR, mLAD 70%,, mRCA 75%, RI 40%, nl RH pressures, nl CI; d. staged PCI 01/05/16: mLAD s/p 2 overlapping DES,  mRCA not sig w/ FFR 0.92  . Cerebrovascular accident [facility](HCC)    a. No residual weakness  . Chronic systolic CHF (congestive heart failure) (HCC)    a. 05/2012 Echo: EF 15%, impaired LV relaxation. Mod concentric LVH. mod dil LA. Severe global HK.  . Dizziness    a. 01/2015 MRI: chronic left basal ganglia infarct, abnormal appearance of basialar and distal vertebral arteries suggestive of flow limiting stenoses, CTA recommended.  . Hyperlipidemia   . Hypertension   . Hypertensive heart disease   . Ischemic cardiomyopathy    a. 05/2012 Echo: EF 15%.  . Morbid obesity (HCC)   . Type II diabetes mellitus (HCC)    Pt reports decreased intake since Saturday (5 days) prior to admission secondary to nausea, vomiting.  Tolerated cranberry juice this pm.  Sister at bedside.  Medications reviewed: dulcolax, colace, aspart, lactulose, senna, NS with KCL 3975ml/hr Labs reviewed: BUN 29, creatinine WDL, glucose 160  Diet Order:  Diet  clear liquid Room service appropriate? Yes; Fluid consistency: Thin  Skin:  Reviewed, no issues  Last BM:  8/13  Height:   Ht Readings from Last 1 Encounters:  01/26/16 6\' 5"  (1.956 m)    Weight: Pt reports fluid wt loss of 25 pounds in the last 2 months.  Sister reports fluid wt loss.  Noted per wt encounters wt loss of 11 pounds in the last year  Wt Readings from Last 1 Encounters:  01/27/16 (!) 305 lb 8 oz (138.6 kg)    Ideal Body Weight:     BMI:  Body mass index is 36.23 kg/m.  Estimated Nutritional Needs:   Kcal:  2100 kcals/d  Protein:  105 g/d  Fluid:  212600ml/d  EDUCATION NEEDS:   No education needs identified at this time  Cace Osorto B. Freida BusmanAllen, RD, LDN [phone] (pager) Weekend/On-Call pager [phone])

## 2016-01-27 NOTE — Progress Notes (Signed)
Inpatient Diabetes Program Recommendations  AACE/ADA: New Consensus Statement on Inpatient Glycemic Control (2015)  Target Ranges:  Prepandial:   less than 140 mg/dL      Peak postprandial:   less than 180 mg/dL (1-2 hours)      Critically ill patients:  140 - 180 mg/dL   Lab Results  Component Value Date   GLUCAP 139 (H) 01/27/2016   HGBA1C 9.2 (H) 02/22/2015    Review of Glycemic Control  Results for [name], [name] (MRN [id number]) as of 01/27/2016 11:05  Ref. Range 01/26/2016 18:31 01/26/2016 20:25 01/27/2016 00:11 01/27/2016 03:52 01/27/2016 07:22  Glucose-Capillary Latest Ref Range: 65 - 99 mg/dL 782215 (H) 956201 (H) 213174 (H) 174 (H) 139 (H)    Diabetes history: Type 2 Outpatient Diabetes medications: Glucophage 500mg  bid Current orders for Inpatient glycemic control: Novolog 0-15 units q4h  Inpatient Diabetes Program Recommendations:    Per ADA recommendations "consider performing an A1C on all patients with diabetes or hypreglycemia admitted to the hospital if not performed in the prior 3 months".  Agree with current orders for blood sugar management  Susette RacerJulie Tanica Gaige, RN, [place], [place]HA, CDE Diabetes Coordinator Inpatient Diabetes Program  [phone] (Team Pager) [phone] [facility] Office) 01/27/2016 11:06 AM

## 2016-01-27 NOTE — Progress Notes (Signed)
[facility] [place] [facility]   PATIENT NAME: [name]    MR#:  [id number]  DATE OF BIRTH:  1946-01-08  SUBJECTIVE:  Came in after having intractable vomitng for 4 days. No BM since Sunday. Sore throat  REVIEW OF SYSTEMS:   Review of Systems  Constitutional: Positive for malaise/fatigue. Negative for chills, fever and weight loss.  HENT: Positive for sore throat. Negative for ear discharge, ear pain and nosebleeds.   Eyes: Negative for blurred vision, pain and discharge.  Respiratory: Negative for sputum production, shortness of breath, wheezing and stridor.   Cardiovascular: Negative for chest pain, palpitations, orthopnea and PND.  Gastrointestinal: Positive for abdominal pain and vomiting. Negative for diarrhea and nausea.  Genitourinary: Negative for frequency and urgency.  Musculoskeletal: Negative for back pain and joint pain.  Neurological: Positive for weakness. Negative for sensory change, speech change and focal weakness.  Psychiatric/Behavioral: Negative for depression and hallucinations. The patient is not nervous/anxious.    Tolerating Diet: Tolerating PT:   DRUG ALLERGIES:  No Known Allergies  VITALS:  Blood pressure (!) 132/94, pulse 65, temperature 99.1 F (37.3 C), temperature source Oral, resp. rate 18, height 6\' 5"  (1.956 m), weight (!) 305 lb 8 oz (138.6 kg), SpO2 94 %.  PHYSICAL EXAMINATION:   Physical Exam  GENERAL:  70 y.o.-year-old patient lying in the bed with no acute distress.  EYES: Pupils equal, round, reactive to light and accommodation. No scleral icterus. Extraocular muscles intact.  HEENT: Head atraumatic, normocephalic. Oropharynx and nasopharynx clear.  NECK:  Supple, no jugular venous distention. No thyroid enlargement, no tenderness.  LUNGS: Normal breath sounds bilaterally, no wheezing, rales, rhonchi. No use of accessory muscles of respiration.  CARDIOVASCULAR: S1, S2 normal. No murmurs, rubs, or  gallops.  ABDOMEN: Soft, nontender, nondistended. Bowel sounds present. No organomegaly or mass.  EXTREMITIES: No cyanosis, clubbing or edema b/l.    NEUROLOGIC: Cranial nerves II through XII are intact. No focal Motor or sensory deficits b/l.   PSYCHIATRIC:  patient is alert and oriented x 3.  SKIN: No obvious rash, lesion, or ulcer.   LABORATORY PANEL:  CBC  Recent Labs Lab 01/27/16 0334  WBC 7.0  HGB 11.6*  HCT 33.2*  PLT 155    Chemistries   Recent Labs Lab 01/26/16 1113 01/27/16 0334  NA 135 135  K 3.8 3.5  CL 96* 97*  CO2 29 31  GLUCOSE 275* 160*  BUN 30* 29*  CREATININE 1.24 0.99  CALCIUM 9.1 8.1*  AST 27  --   ALT 18  --   ALKPHOS 71  --   BILITOT 0.8  --    Cardiac Enzymes  Recent Labs Lab 01/26/16 1113  TROPONINI <0.03   RADIOLOGY:  Abd 1 View (kub)  Result Date: 01/27/2016 CLINICAL DATA:  Abdominal pain, possible ileus, post cardiac catheterization 01/05/2016 EXAM: ABDOMEN - 1 VIEW COMPARISON:  01/26/2016 CT scan abdomen and pelvis. FINDINGS: There is mild gaseous distension of the proximal small bowel in mid upper abdomen suspicious for ileus, enteritis or early bowel obstruction. Contrast material from recent CT scan noted within urinary bladder. Partially visualized left hip prosthesis. IMPRESSION: Mild gaseous distention proximal small bowel mid upper abdomen suspicious for ileus, enteritis or early bowel obstruction. Electronically Signed   By: Natasha MeadLiviu  Pop M.D.   On: 01/27/2016 08:27   Ct Abdomen Pelvis W Contrast  Result Date: 01/26/2016 CLINICAL DATA:  Vomiting and upper abdominal pain for several days, initial encounter EXAM:  CT ABDOMEN AND PELVIS WITH CONTRAST TECHNIQUE: Multidetector CT imaging of the abdomen and pelvis was performed using the standard protocol following bolus administration of intravenous contrast. CONTRAST:  100mL ISOVUE-300 IOPAMIDOL (ISOVUE-300) INJECTION 61% COMPARISON:  None. FINDINGS: Lower chest:  No acute findings.  Hepatobiliary: No masses or other significant abnormality. Pancreas: No mass, inflammatory changes, or other significant abnormality. Spleen: Within normal limits in size and appearance. Adrenals/Urinary Tract: Small hypodensities likely representing cysts Are noted bilaterally measuring 1 cm less with the exception of an exophytic cyst arising from the posterior medial aspect of the right kidney which measures 2 cm. No calculi or obstructive changes are noted. The bladder is well distended. Stomach/Bowel: The stomach is distended with fluid. Proximal small bowel dilatation is noted. This extends into the proximal ileum and a relative transition zone is noted in the right mid abdomen on image number 71 of series 2 although no lesion is seen. The distal ileum is within normal limits. The colon is unremarkable. The appendix is within normal limits. Vascular/Lymphatic: No pathologically enlarged lymph nodes. No evidence of abdominal aortic aneurysm. Reproductive: No mass or other significant abnormality. Other: Minimal amount of free pelvic fluid is noted of uncertain significance but may be related to the small bowel dilatation. No free air is seen. Musculoskeletal: No acute bony abnormality is noted. Left hip replacement is seen. IMPRESSION: Mild small bowel dilatation within the jejunum and proximal ileum without focal obstructing mass lesion. This may represent some mild enteritis or small-bowel ileus correlation with the physical exam is recommended. Scattered renal hypodensities likely representing cysts. No other significant abnormality is noted. Electronically Signed   By: Alcide CleverMark  Lukens M.D.   On: 01/26/2016 14:01   Dg Chest Port 1 View  Result Date: 01/26/2016 CLINICAL DATA:  Shortness of breath with vomiting EXAM: PORTABLE CHEST 1 VIEW COMPARISON:  December 07, 2015 FINDINGS: There is stable eventration of the right hemidiaphragm. There is atelectasis in the right lower lobe. The lungs elsewhere are clear.  Heart is mildly enlarged with pulmonary vascular within normal limits. No adenopathy. No bone lesions. IMPRESSION: Right lower lobe atelectasis. No edema or consolidation. Stable cardiac prominence. Stable eventration of the right hemidiaphragm. Electronically Signed   By: Bretta BangWilliam  Woodruff III M.D.   On: 01/26/2016 12:55   ASSESSMENT AND PLAN:  * Ileus likely due to constipation vs viral GE with persistent vomiting for 4 days - Dulcolax suppository. Add colace and senna. -start CLD NGT if he continues have vomiting. Ct abd showed ileus/SB obstruction.  * CAD with recent DES Continue ASA and Plavix  * HTN Home meds IV PRN meds.  * DM SSI. Q4 accuchecks while NPO.  * DVT prophylaxis Lovenox  Case discussed with Care Management/Social Worker. Management plans discussed with the patient, family and they are in agreement.  CODE STATUS: full  DVT Prophylaxis: lovenox TOTAL TIME TAKING CARE OF THIS PATIENT:40* minutes.  >50% time spent on counselling and coordination of care  POSSIBLE D/C IN 1-2DAYS, DEPENDING ON CLINICAL CONDITION.  Note: This dictation was prepared with Dragon dictation along with smaller phrase technology. Any transcriptional errors that result from this process are unintentional.  Pattiann Solanki M.D on 01/27/2016 at 4:19 PM  Between 7am to 6pm - Pager - [phone]  After 6pm go to [email or web address] - password [email or web address] [facility]  [facility]  Office  [phone]  CC: Primary care physician; Thornton PapasSykes, Larry Justain, PA-C

## 2016-01-27 NOTE — ED Provider Notes (Signed)
[facility] Emergency Department Provider Note  ____________________________________________ ----------------------------------------- 11:03 PM on 01/27/2016 -----------------------------------------  Late entry I have reviewed the triage vital signs and the nursing notes.   HISTORY  Chief Complaint Emesis    HPI Maryagnes AmosGary V Granholm is a 70 y.o. male who presents today complaining of nausea and vomiting for 2-3 days. Nonbloody nonbilious vomiting. Is slightly dark however. Denies any rectal pain or bleeding or melena. Has epigastric abdominal discomfort. Denies chest pain or shortness of breath. Nothing makes the pain better nothing makes the pain worse. Feels somewhat distended. Did have recent heart stents. This feels nothing like his heart pain.     Past Medical History:  Diagnosis Date  . CAD (coronary artery disease)    a. 11/2009 Cath: LAD 90 (3.0x12 Xience DES);  b. 09/2011 LAD stent ok, 60d (nl FFR-->Med Rx); c. cardiac cath 12/29/15: p-mLAD 30% w/ ISR, mLAD 70%,, mRCA 75%, RI 40%, nl RH pressures, nl CI; d. staged PCI 01/05/16: mLAD s/p 2 overlapping DES,  mRCA not sig w/ FFR 0.92  . Cerebrovascular accident [facility](HCC)    a. No residual weakness  . Chronic systolic CHF (congestive heart failure) (HCC)    a. 05/2012 Echo: EF 15%, impaired LV relaxation. Mod concentric LVH. mod dil LA. Severe global HK.  . Dizziness    a. 01/2015 MRI: chronic left basal ganglia infarct, abnormal appearance of basialar and distal vertebral arteries suggestive of flow limiting stenoses, CTA recommended.  . Hyperlipidemia   . Hypertension   . Hypertensive heart disease   . Ischemic cardiomyopathy    a. 05/2012 Echo: EF 15%.  . Morbid obesity (HCC)   . Type II diabetes mellitus [facility](HCC)     Patient Active Problem List   Diagnosis Date Noted  . Ileus (HCC) 01/26/2016  . Hypertensive heart disease 01/06/2016  . Coronary artery disease involving native coronary artery of native  heart with angina pectoris with documented spasm (HCC)   . Coronary artery disease involving native coronary artery of native heart with angina pectoris (HCC) 01/05/2016  . Effort angina (HCC) 01/05/2016  . Coronary artery disease 01/05/2016  . Chronic systolic heart failure (HCC) 12/28/2015  . Fatigue 12/28/2015  . Depression 12/28/2015  . Cardiomyopathy, ischemic 12/08/2015  . Orthopnea 12/06/2015  . Chest pain 12/06/2015  . Dyspnea on exertion   . Coronary artery disease involving native coronary artery of native heart   . Uncontrolled type 2 diabetes mellitus with circulatory disorder, without long-term current use of insulin (HCC)   . Back pain 08/06/2013  . Hyperlipidemia 04/07/2013  . Chronic combined systolic and diastolic CHF, NYHA class 3 (HCC) 07/04/2012  . CAD (coronary artery disease) 07/04/2012  . Essential hypertension 07/04/2012    Past Surgical History:  Procedure Laterality Date  . CARDIAC CATHETERIZATION  2013   showing 60% LAD disease after stent, no stent placed based on FFR pressure wire results  . CARDIAC CATHETERIZATION  2011   showing 90% LAD stenosis s/p stent placement  . CARDIAC CATHETERIZATION Bilateral 12/29/2015   Procedure: Right/Left Heart Cath and Coronary Angiography;  Surgeon: Marykay Lexavid W Harding, MD;  [facility] LAB;  Service: Cardiovascular;  Laterality: Bilateral;  . CARDIAC CATHETERIZATION Right 01/05/2016   Procedure: Coronary Stent Intervention;  Surgeon: Iran OuchMuhammad A Arida, MD;  Location: [facility] INVASIVE CV LAB;  Service: Cardiovascular;  Laterality: Right;  . CORONARY ANGIOPLASTY WITH STENT PLACEMENT    . KNEE CARTILAGE SURGERY     right  . KNEE  SURGERY     left  . LASIK Bilateral 2015   eye sugeries  . TOTAL HIP ARTHROPLASTY Left 2014    Prior to Admission medications   Medication Sig Start Date End Date Taking? Authorizing Provider  amLODipine (NORVASC) 10 MG tablet Take 1 tablet (10 mg total) by mouth daily. 03/16/14  Yes  Antonieta Iba, MD  aspirin EC 81 MG tablet Take 81 mg by mouth every morning.   Yes Historical Provider, MD  atorvastatin (LIPITOR) 40 MG tablet Take 1 tablet (40 mg total) by mouth daily at 6 PM. 01/06/16  Yes Ryan M Dunn, PA-C  carvedilol (COREG) 25 MG tablet Take 1 tablet (25 mg total) by mouth 2 (two) times daily with a meal. 01/06/16  Yes Ryan M Dunn, PA-C  clopidogrel (PLAVIX) 75 MG tablet Take 1 tablet (75 mg total) by mouth daily. 01/06/16  Yes Ryan M Dunn, PA-C  furosemide (LASIX) 80 MG tablet Take 1 tablet (80 mg total) by mouth 2 (two) times daily. 01/06/16  Yes Ryan M Dunn, PA-C  lisinopril (PRINIVIL,ZESTRIL) 40 MG tablet Take 1 tablet (40 mg total) by mouth daily. 01/06/16  Yes Ryan M Dunn, PA-C  metFORMIN (GLUCOPHAGE) 500 MG tablet Take 1 tablet (500 mg total) by mouth 2 (two) times daily with a meal. Restart metformin on 01/08/2016. 01/06/16  Yes Ryan M Dunn, PA-C  potassium chloride (K-DUR) 10 MEQ tablet Take 1 tablet (10 mEq total) by mouth 2 (two) times daily as needed. 12/31/15  Yes Antonieta Iba, MD  spironolactone (ALDACTONE) 25 MG tablet Take 12.5 mg by mouth daily. 12/09/15  Yes Historical Provider, MD  metolazone (ZAROXOLYN) 2.5 MG tablet Take 1 tablet (2.5 mg total) by mouth daily as needed (fluid). 01/06/16   Sondra Barges, PA-C    Allergies Review of patient's allergies indicates no known allergies.  Family History  Problem Relation Age of Onset  . CAD Father     died @ 75 of MI - CAD dx in his 32's.  Marland Kitchen CAD Brother     several brothers died of MI  . CAD Sister     died of MI.    Social History Social History  Substance Use Topics  . Smoking status: Never Smoker  . Smokeless tobacco: Never Used  . Alcohol use 3.6 oz/week    6 Cans of beer per week    Review of Systems Constitutional: No fever/chills Eyes: No visual changes. ENT: No sore throat. No stiff neck no neck pain Cardiovascular: Denies chest pain. Respiratory: Denies shortness of  breath. Gastrointestinal:   Positive vomiting.  No diarrhea.  No constipation. Genitourinary: Negative for dysuria. Musculoskeletal: Negative lower extremity swelling Skin: Negative for rash. Neurological: Negative for severe headaches, focal weakness or numbness. 10-point ROS otherwise negative.  ____________________________________________   PHYSICAL EXAM:  VITAL SIGNS: ED Triage Vitals  Enc Vitals Group     BP 01/26/16 1109 130/83     Pulse Rate 01/26/16 1109 95     Resp 01/26/16 1109 18     Temp 01/26/16 1109 97.7 F (36.5 C)     Temp Source 01/26/16 1109 Oral     SpO2 01/26/16 1109 97 %     Weight 01/26/16 1101 (!) 302 lb (137 kg)     Height 01/26/16 1101 6\' 5"  (1.956 m)     Head Circumference --      Peak Flow --      Pain Score 01/26/16 1101 7  Pain Loc --      Pain Edu? --      Excl. in GC? --     Constitutional: Alert and oriented. Patient appears uncomfortable but nontoxic Eyes: Conjunctivae are normal. PERRL. EOMI. Head: Atraumatic. Nose: No congestion/rhinnorhea. Mouth/Throat: Mucous membranes are moist.  Oropharynx non-erythematous. Neck: No stridor.   Nontender with no meningismus Cardiovascular: Normal rate, regular rhythm. Grossly normal heart sounds.  Good peripheral circulation. Respiratory: Normal respiratory effort.  No retractions. Lungs CTAB. Abdominal: Soft and tenderness to palpation in epigastric region, morbid obesity noted. No distention. No guarding no rebound Back:  There is no focal tenderness or step off.  there is no midline tenderness there are no lesions noted. there is no CVA tenderness Musculoskeletal: No lower extremity tenderness, no upper extremity tenderness. No joint effusions, no DVT signs strong distal pulses no edema Neurologic:  Normal speech and language. No gross focal neurologic deficits are appreciated.  Skin:  Skin is warm, dry and intact. No rash noted. Psychiatric: Mood and affect are normal. Speech and behavior are  normal.  ____________________________________________   LABS (all labs ordered are listed, but only abnormal results are displayed)  Labs Reviewed  COMPREHENSIVE METABOLIC PANEL - Abnormal; Notable for the following:       Result Value   Chloride 96 (*)    Glucose, Bld 275 (*)    BUN 30 (*)    GFR calc non Af Amer 58 (*)    All other components within normal limits  CBC - Abnormal; Notable for the following:    HCT 38.5 (*)    All other components within normal limits  URINALYSIS COMPLETEWITH MICROSCOPIC ([facility] ONLY) - Abnormal; Notable for the following:    Color, Urine AMBER (*)    APPearance HAZY (*)    Glucose, UA 100 (*)    Protein, ur 100 (*)    Squamous Epithelial / LPF 0-5 (*)    All other components within normal limits  BASIC METABOLIC PANEL - Abnormal; Notable for the following:    Chloride 97 (*)    Glucose, Bld 160 (*)    BUN 29 (*)    Calcium 8.1 (*)    All other components within normal limits  CBC - Abnormal; Notable for the following:    RBC 3.88 (*)    Hemoglobin 11.6 (*)    HCT 33.2 (*)    All other components within normal limits  GLUCOSE, CAPILLARY - Abnormal; Notable for the following:    Glucose-Capillary 215 (*)    All other components within normal limits  GLUCOSE, CAPILLARY - Abnormal; Notable for the following:    Glucose-Capillary 201 (*)    All other components within normal limits  GLUCOSE, CAPILLARY - Abnormal; Notable for the following:    Glucose-Capillary 174 (*)    All other components within normal limits  GLUCOSE, CAPILLARY - Abnormal; Notable for the following:    Glucose-Capillary 174 (*)    All other components within normal limits  GLUCOSE, CAPILLARY - Abnormal; Notable for the following:    Glucose-Capillary 139 (*)    All other components within normal limits  GLUCOSE, CAPILLARY - Abnormal; Notable for the following:    Glucose-Capillary 144 (*)    All other components within normal limits  GLUCOSE, CAPILLARY - Abnormal;  Notable for the following:    Glucose-Capillary 155 (*)    All other components within normal limits  GLUCOSE, CAPILLARY - Abnormal; Notable for the following:    Glucose-Capillary 148 (*)  All other components within normal limits  LIPASE, BLOOD  TROPONIN I  TSH   ____________________________________________  EKG  I personally interpreted any EKGs ordered by me or triage  ____________________________________________  RADIOLOGY  I reviewed any imaging ordered by me or triage that were performed during my shift and, if possible, patient and/or family made aware of any abnormal findings. ____________________________________________   PROCEDURES  Procedure(s) performed: None  Procedures  Critical Care performed: None  ____________________________________________   INITIAL IMPRESSION / ASSESSMENT AND PLAN / ED COURSE  Pertinent labs & imaging results that were available during my care of the patient were reviewed by me and considered in my medical decision making (see chart for details).  Patient with persistent nausea and vomiting abdominal pain, CT is reassuring suffered ileus, patient has had recent stents. No evidence at this time that this is a cardiogenic pathology. However, he is still uncomfortable after CT and unable to tolerate by mouth very well. We will admit him for observation for his ileus to ensure that no other pathology develops and that gets IV hydration.  Clinical Course   ____________________________________________   FINAL CLINICAL IMPRESSION(S) / ED DIAGNOSES  Final diagnoses:  Abdominal pain  Non-intractable vomiting with nausea, vomiting of unspecified type      This chart was dictated using voice recognition software.  Despite best efforts to proofread,  errors can occur which can change meaning.      Jeanmarie PlantJames A Janaiya Beauchesne, MD 01/27/16 [phone]

## 2016-01-27 NOTE — Care Management Important Message (Signed)
Important Message  Patient Details  Name: [name] MRN: [id number] Date of Birth: [birth year]   Medicare Important Message Given:  Yes    Gwenette GreetBrenda S Hala Narula, RN 01/27/2016, 11:15 AM

## 2016-01-27 NOTE — Plan of Care (Signed)
Problem: Pain Managment: Goal: General experience of comfort will improve Outcome: Progressing Abdominal pain managed with morphine and tramadol.  Problem: Fluid Volume: Goal: Ability to maintain a balanced intake and output will improve Outcome: Progressing Poor PO intake. IVF d/c. Pt encouraged to drink more fluids. Zofran given for nausea once with improvement.   Problem: Nutrition: Goal: Adequate nutrition will be maintained Outcome: Progressing As Above  Problem: Bowel/Gastric: Goal: Will not experience complications related to bowel motility Outcome: Not Progressing Dulcolax supp, senna, colace and lactulose given to pt, but no BM during the shift. Enema offered to pt, but pt requests to administer it later. Night RN is aware. Pt passing flatus.

## 2016-01-28 LAB — GLUCOSE, CAPILLARY
GLUCOSE-CAPILLARY: 179 mg/dL — AB (ref 65–99)
GLUCOSE-CAPILLARY: 174 mg/dL — AB (ref 65–99)
Glucose-Capillary: 247 mg/dL — ABNORMAL HIGH (ref 65–99)

## 2016-01-28 MED ORDER — PROCHLORPERAZINE EDISYLATE 5 MG/ML IJ SOLN
10.0000 mg | Freq: Once | INTRAMUSCULAR | Status: AC
  Administered 2016-01-28: 10 mg via INTRAVENOUS
  Filled 2016-01-28: qty 2

## 2016-01-28 MED ORDER — CHLORPROMAZINE HCL 10 MG PO TABS
10.0000 mg | ORAL_TABLET | Freq: Three times a day (TID) | ORAL | Status: DC | PRN
  Administered 2016-01-28: 02:00:00 10 mg via ORAL
  Filled 2016-01-28 (×2): qty 1

## 2016-01-28 MED ORDER — POLYETHYLENE GLYCOL 3350 17 G PO PACK: 17.0000 g | PACK | Freq: Every day | ORAL | 0 refills | Status: AC | PRN

## 2016-01-28 MED ORDER — DOCUSATE SODIUM 100 MG PO CAPS: 100.0000 mg | ORAL_CAPSULE | Freq: Two times a day (BID) | ORAL | 0 refills | Status: AC

## 2016-01-28 NOTE — Discharge Summary (Signed)
[facility] - [place] [facility]   PATIENT NAME: [name]    MR#:  [id number]  DATE OF BIRTH:  11/10/1945  DATE OF ADMISSION:  01/26/2016 ADMITTING PHYSICIAN: Milagros Loll, MD  DATE OF DISCHARGE: 01/28/16  PRIMARY CARE PHYSICIAN: Thornton Papas, PA-C    ADMISSION DIAGNOSIS:  Ileus (HCC) [K56.7] Abdominal pain [R10.9] Non-intractable vomiting with nausea, vomiting of unspecified type [R11.2]  DISCHARGE DIAGNOSIS:  Ileus due to Constipation-improved Intractable nausea/vomiting-resovled  SECONDARY DIAGNOSIS:   Past Medical History:  Diagnosis Date  . CAD (coronary artery disease)    a. 11/2009 Cath: LAD 90 (3.0x12 Xience DES);  b. 09/2011 LAD stent ok, 60d (nl FFR-->Med Rx); c. cardiac cath 12/29/15: p-mLAD 30% w/ ISR, mLAD 70%,, mRCA 75%, RI 40%, nl RH pressures, nl CI; d. staged PCI 01/05/16: mLAD s/p 2 overlapping DES,  mRCA not sig w/ FFR 0.92  . Cerebrovascular accident [facility])    a. No residual weakness  . Chronic systolic CHF (congestive heart failure) (HCC)    a. 05/2012 Echo: EF 15%, impaired LV relaxation. Mod concentric LVH. mod dil LA. Severe global HK.  . Dizziness    a. 01/2015 MRI: chronic left basal ganglia infarct, abnormal appearance of basialar and distal vertebral arteries suggestive of flow limiting stenoses, CTA recommended.  . Hyperlipidemia   . Hypertension   . Hypertensive heart disease   . Ischemic cardiomyopathy    a. 05/2012 Echo: EF 15%.  . Morbid obesity (HCC)   . Type II diabetes mellitus [facility])     HOSPITAL COURSE:   * Ileus likely due to constipation  with persistent vomiting for 4 days - Dulcolax suppository. Add colace and senna.---pt had 3 small BM and 2 large BM today -started CLD---soft diet(pt request) Ct abd showed ileus/SB obstruction.  * CAD with recent DES Continue ASA and Plavix  * HTN Home meds IV PRN meds.  * DM SSI. Q4 accuchecks while NPO.  * DVT prophylaxis Lovenox  If pt  tolerates soft diet the wil d/c home CONSULTS OBTAINED:    DRUG ALLERGIES:  No Known Allergies  DISCHARGE MEDICATIONS:   Current Discharge Medication List    START taking these medications   Details  docusate sodium (COLACE) 100 MG capsule Take 1 capsule (100 mg total) by mouth 2 (two) times daily. Qty: 10 capsule, Refills: 0    polyethylene glycol (MIRALAX / GLYCOLAX) packet Take 17 g by mouth daily as needed for mild constipation. Qty: 14 each, Refills: 0      CONTINUE these medications which have NOT CHANGED   Details  amLODipine (NORVASC) 10 MG tablet Take 1 tablet (10 mg total) by mouth daily. Qty: 90 tablet, Refills: 3    aspirin EC 81 MG tablet Take 81 mg by mouth every morning.    atorvastatin (LIPITOR) 40 MG tablet Take 1 tablet (40 mg total) by mouth daily at 6 PM. Qty: 90 tablet, Refills: 3    carvedilol (COREG) 25 MG tablet Take 1 tablet (25 mg total) by mouth 2 (two) times daily with a meal. Qty: 180 tablet, Refills: 3    clopidogrel (PLAVIX) 75 MG tablet Take 1 tablet (75 mg total) by mouth daily. Qty: 90 tablet, Refills: 3    furosemide (LASIX) 80 MG tablet Take 1 tablet (80 mg total) by mouth 2 (two) times daily. Qty: 180 tablet, Refills: 3    lisinopril (PRINIVIL,ZESTRIL) 40 MG tablet Take 1 tablet (40 mg total) by mouth daily. Qty: 90  tablet, Refills: 3    metFORMIN (GLUCOPHAGE) 500 MG tablet Take 1 tablet (500 mg total) by mouth 2 (two) times daily with a meal. Restart metformin on 01/08/2016. Qty: 180 tablet, Refills: 3    potassium chloride (K-DUR) 10 MEQ tablet Take 1 tablet (10 mEq total) by mouth 2 (two) times daily as needed. Qty: 180 tablet, Refills: 3    spironolactone (ALDACTONE) 25 MG tablet Take 12.5 mg by mouth daily.    metolazone (ZAROXOLYN) 2.5 MG tablet Take 1 tablet (2.5 mg total) by mouth daily as needed (fluid). Qty: 90 tablet, Refills: 3        If you experience worsening of your admission symptoms, develop shortness of  breath, life threatening emergency, suicidal or homicidal thoughts you must seek medical attention immediately by calling 911 or calling your MD immediately  if symptoms less severe.  You Must read complete instructions/literature along with all the possible adverse reactions/side effects for all the Medicines you take and that have been prescribed to you. Take any new Medicines after you have completely understood and accept all the possible adverse reactions/side effects.   Please note  You were cared for by a hospitalist during your hospital stay. If you have any questions about your discharge medications or the care you received while you were in the hospital after you are discharged, you can call the unit and asked to speak with the hospitalist on call if the hospitalist that took care of you is not available. Once you are discharged, your primary care physician will handle any further medical issues. Please note that NO REFILLS for any discharge medications will be authorized once you are discharged, as it is imperative that you return to your primary care physician (or establish a relationship with a primary care physician if you do not have one) for your aftercare needs so that they can reassess your need for medications and monitor your lab values. Today   SUBJECTIVE   Feels a lot better after having BM;s this am. Wants to eat some fruit and soft diet  VITAL SIGNS:  Blood pressure (!) 144/92, pulse 91, temperature 99.4 F (37.4 C), temperature source Oral, resp. rate 18, height 6\' 5"  (1.956 m), weight (!) 307 lb 1.6 oz (139.3 kg), SpO2 93 %.  I/O:   Intake/Output Summary (Last 24 hours) at 01/28/16 1149 Last data filed at 01/27/16 2009  Gross per 24 hour  Intake                0 ml  Output              700 ml  Net             -700 ml    PHYSICAL EXAMINATION:  GENERAL:  70 y.o.-year-old patient lying in the bed with no acute distress. obese EYES: Pupils equal, round, reactive to  light and accommodation. No scleral icterus. Extraocular muscles intact.  HEENT: Head atraumatic, normocephalic. Oropharynx and nasopharynx clear.  NECK:  Supple, no jugular venous distention. No thyroid enlargement, no tenderness.  LUNGS: Normal breath sounds bilaterally, no wheezing, rales,rhonchi or crepitation. No use of accessory muscles of respiration.  CARDIOVASCULAR: S1, S2 normal. No murmurs, rubs, or gallops.  ABDOMEN: Soft, non-tender, non-distended. Bowel sounds present. No organomegaly or mass.  EXTREMITIES: No pedal edema, cyanosis, or clubbing.  NEUROLOGIC: Cranial nerves II through XII are intact. Muscle strength 5/5 in all extremities. Sensation intact. Gait not checked.  PSYCHIATRIC: The patient is alert and  oriented x 3.  SKIN: No obvious rash, lesion, or ulcer.   DATA REVIEW:   CBC   Recent Labs Lab 01/27/16 0334  WBC 7.0  HGB 11.6*  HCT 33.2*  PLT 155    Chemistries   Recent Labs Lab 01/26/16 1113 01/27/16 0334  NA 135 135  K 3.8 3.5  CL 96* 97*  CO2 29 31  GLUCOSE 275* 160*  BUN 30* 29*  CREATININE 1.24 0.99  CALCIUM 9.1 8.1*  AST 27  --   ALT 18  --   ALKPHOS 71  --   BILITOT 0.8  --     Microbiology Results   No results found for this or any previous visit (from the past 240 hour(s)).  RADIOLOGY:  Abd 1 View (kub)  Result Date: 01/27/2016 CLINICAL DATA:  Abdominal pain, possible ileus, post cardiac catheterization 01/05/2016 EXAM: ABDOMEN - 1 VIEW COMPARISON:  01/26/2016 CT scan abdomen and pelvis. FINDINGS: There is mild gaseous distension of the proximal small bowel in mid upper abdomen suspicious for ileus, enteritis or early bowel obstruction. Contrast material from recent CT scan noted within urinary bladder. Partially visualized left hip prosthesis. IMPRESSION: Mild gaseous distention proximal small bowel mid upper abdomen suspicious for ileus, enteritis or early bowel obstruction. Electronically Signed   By: Natasha MeadLiviu  Pop M.D.   On:  01/27/2016 08:27   Ct Abdomen Pelvis W Contrast  Result Date: 01/26/2016 CLINICAL DATA:  Vomiting and upper abdominal pain for several days, initial encounter EXAM: CT ABDOMEN AND PELVIS WITH CONTRAST TECHNIQUE: Multidetector CT imaging of the abdomen and pelvis was performed using the standard protocol following bolus administration of intravenous contrast. CONTRAST:  100mL ISOVUE-300 IOPAMIDOL (ISOVUE-300) INJECTION 61% COMPARISON:  None. FINDINGS: Lower chest:  No acute findings. Hepatobiliary: No masses or other significant abnormality. Pancreas: No mass, inflammatory changes, or other significant abnormality. Spleen: Within normal limits in size and appearance. Adrenals/Urinary Tract: Small hypodensities likely representing cysts Are noted bilaterally measuring 1 cm less with the exception of an exophytic cyst arising from the posterior medial aspect of the right kidney which measures 2 cm. No calculi or obstructive changes are noted. The bladder is well distended. Stomach/Bowel: The stomach is distended with fluid. Proximal small bowel dilatation is noted. This extends into the proximal ileum and a relative transition zone is noted in the right mid abdomen on image number 71 of series 2 although no lesion is seen. The distal ileum is within normal limits. The colon is unremarkable. The appendix is within normal limits. Vascular/Lymphatic: No pathologically enlarged lymph nodes. No evidence of abdominal aortic aneurysm. Reproductive: No mass or other significant abnormality. Other: Minimal amount of free pelvic fluid is noted of uncertain significance but may be related to the small bowel dilatation. No free air is seen. Musculoskeletal: No acute bony abnormality is noted. Left hip replacement is seen. IMPRESSION: Mild small bowel dilatation within the jejunum and proximal ileum without focal obstructing mass lesion. This may represent some mild enteritis or small-bowel ileus correlation with the physical  exam is recommended. Scattered renal hypodensities likely representing cysts. No other significant abnormality is noted. Electronically Signed   By: Alcide CleverMark  Lukens M.D.   On: 01/26/2016 14:01   Dg Chest Port 1 View  Result Date: 01/26/2016 CLINICAL DATA:  Shortness of breath with vomiting EXAM: PORTABLE CHEST 1 VIEW COMPARISON:  December 07, 2015 FINDINGS: There is stable eventration of the right hemidiaphragm. There is atelectasis in the right lower lobe. The lungs elsewhere are  clear. Heart is mildly enlarged with pulmonary vascular within normal limits. No adenopathy. No bone lesions. IMPRESSION: Right lower lobe atelectasis. No edema or consolidation. Stable cardiac prominence. Stable eventration of the right hemidiaphragm. Electronically Signed   By: Bretta Bang III M.D.   On: 01/26/2016 12:55     Management plans discussed with the patient, family and they are in agreement.  CODE STATUS:     Code Status Orders        Start     Ordered   01/26/16 1523  Full code  Continuous     01/26/16 1524    Code Status History    Date Active Date Inactive Code Status Order ID Comments User Context   01/05/2016  4:20 PM 01/06/2016  4:58 PM Full Code [id number]  Iran Ouch, MD Inpatient   12/29/2015 11:33 AM 12/29/2015  4:35 PM Full Code [id number]  Marykay Lex, MD Inpatient   12/06/2015 11:21 AM 12/09/2015  5:22 PM Full Code [id number]  Altamese Dilling, MD ED    Advance Directive Documentation   Flowsheet Row Most Recent Value  Type of Advance Directive  Healthcare Power of Attorney  Pre-existing out of facility DNR order (yellow form or pink MOST form)  No data  "MOST" Form in Place?  No data      TOTAL TIME TAKING CARE OF THIS PATIENT: 40 minutes.    Murielle Stang M.D on 01/28/2016 at 11:49 AM  Between 7am to 6pm - Pager - [phone] After 6pm go to [email or web address] - password [email or web address] [facility]  [facility]  Office  [phone]  CC: Primary care physician; Thornton Papas, PA-C

## 2016-01-28 NOTE — Progress Notes (Signed)
Discharge instructions given and went over with patient at bedside. All questions answered. Patient discharged home with family via wheelchair by nursing staff. Bo McclintockBrewer,Kendrick Haapala S, RN

## 2016-02-11 DEATH — deceased

## 2016-04-03 ENCOUNTER — Other Ambulatory Visit: Payer: Medicare Other

## 2016-04-11 ENCOUNTER — Ambulatory Visit: Payer: Medicare Other | Admitting: Cardiovascular Disease

## 2016-08-28 IMAGING — CT CT ABD-PELV W/ CM
2 of 5 series · 16 of 46 positions shown, 18 images · IV contrast (APPLIED)
Comparison: None.

CLINICAL DATA: Vomiting and upper abdominal pain for several days,
initial encounter

EXAM:
CT ABDOMEN AND PELVIS WITH CONTRAST
TECHNIQUE: Multidetector CT imaging of the abdomen and pelvis was performed
using the standard protocol following bolus administration of
intravenous contrast.
CONTRAST:  100mL [id number] IOPAMIDOL ([id number]) INJECTION 61%

[Series 2: axial st · axial · 0.95mm/px · z∈[-1007,-482]mm · 13 of 119 slices shown, 15 images]
[im 7/119  soft-tissue]
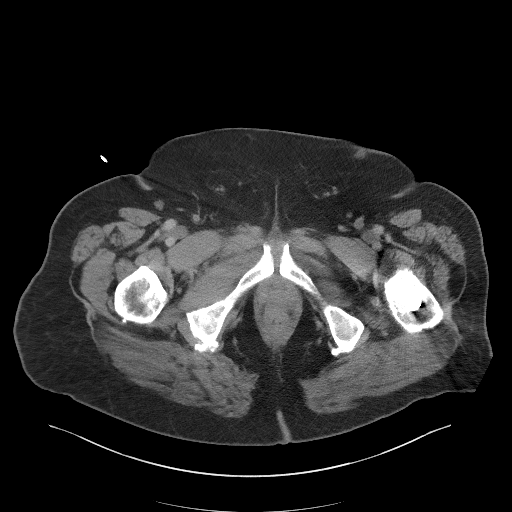
[im 7/119  bone]
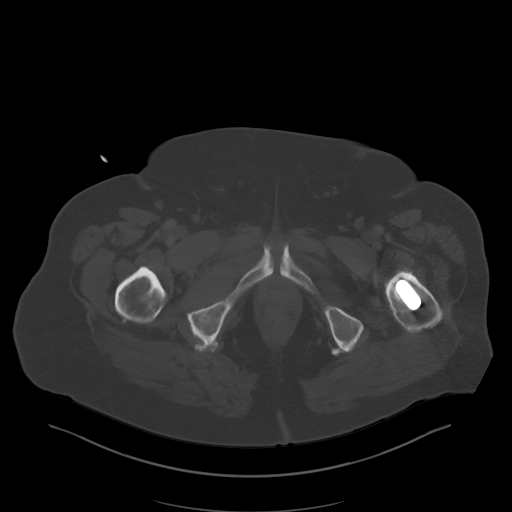
[im 14/119  soft-tissue]
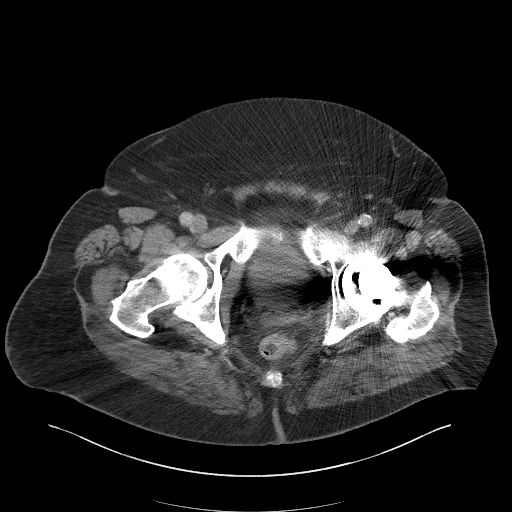
[im 27/119  soft-tissue]
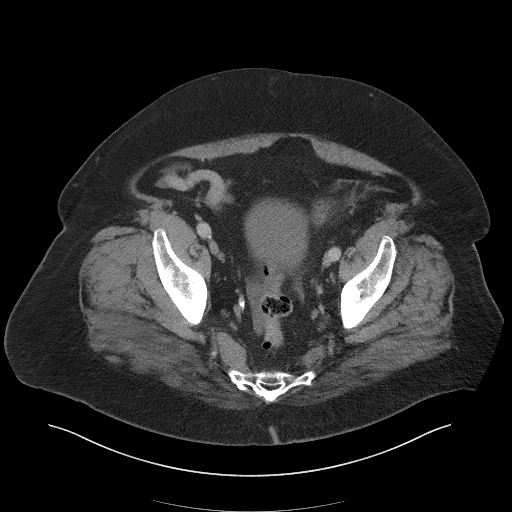
[im 33/119  soft-tissue]
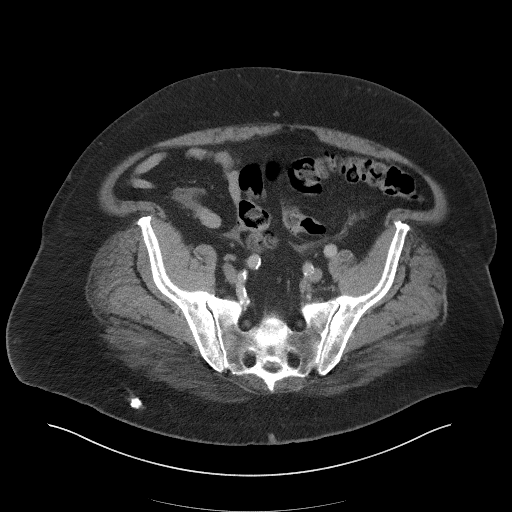
[im 40/119  soft-tissue]
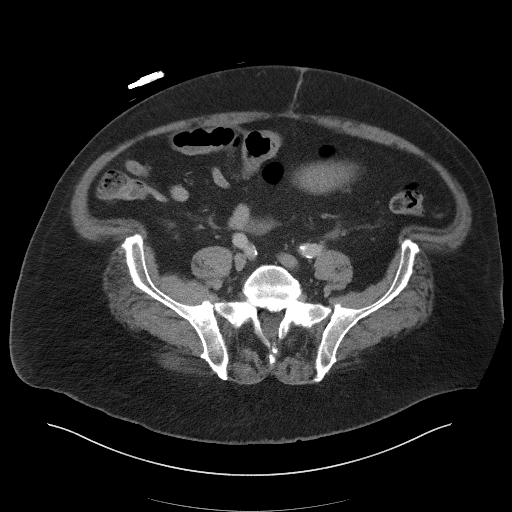
[im 53/119  soft-tissue]
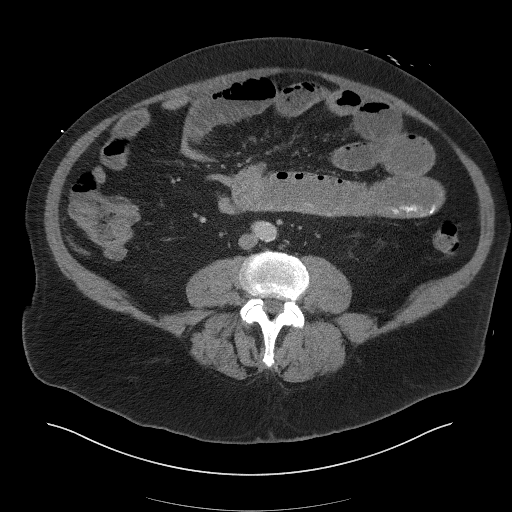
[im 60/119  soft-tissue]
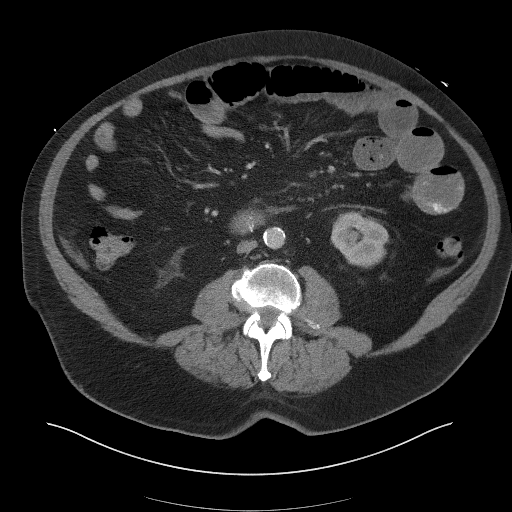
[im 66/119  soft-tissue]
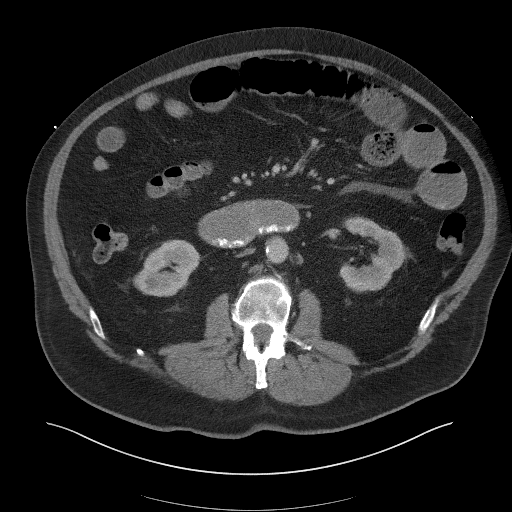
[im 79/119  soft-tissue]
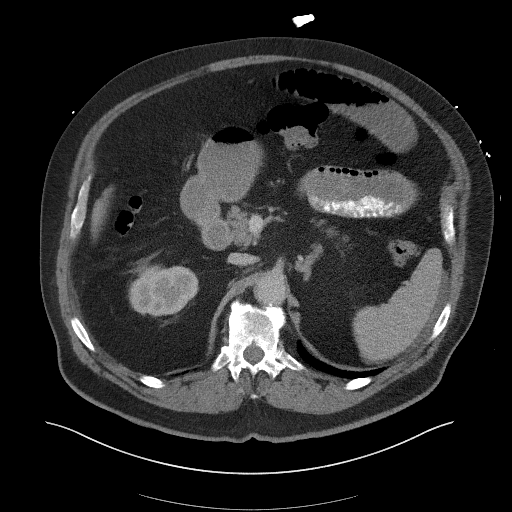
[im 79/119  bone]
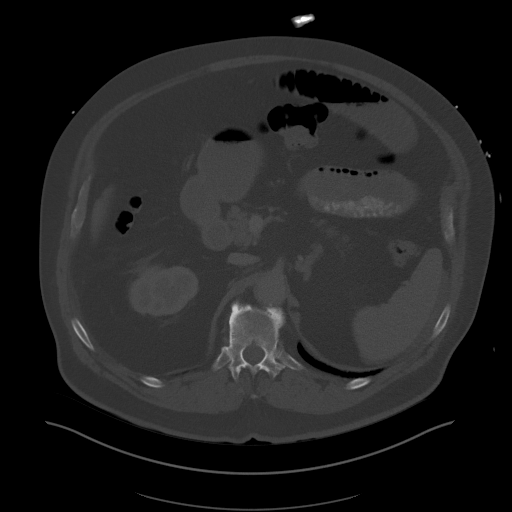
[im 86/119  soft-tissue]
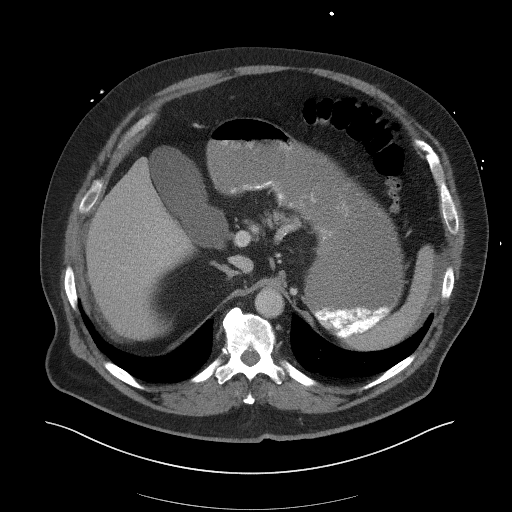
[im 92/119  soft-tissue]
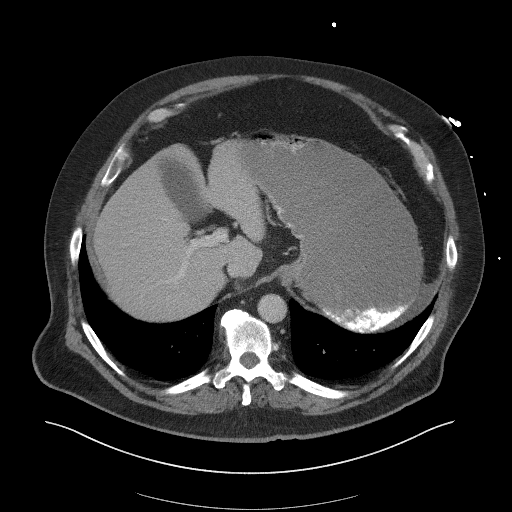
[im 105/119  soft-tissue]
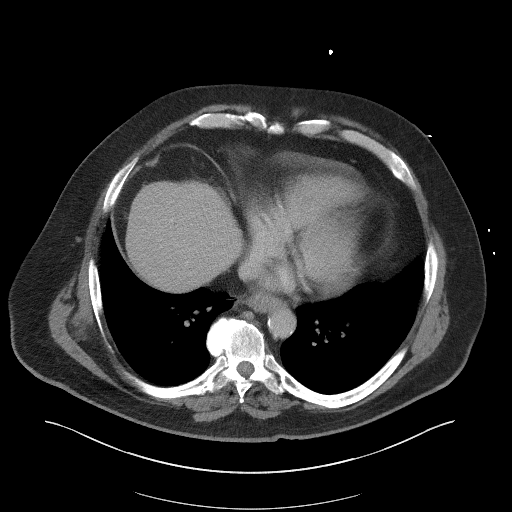
[im 112/119  soft-tissue]
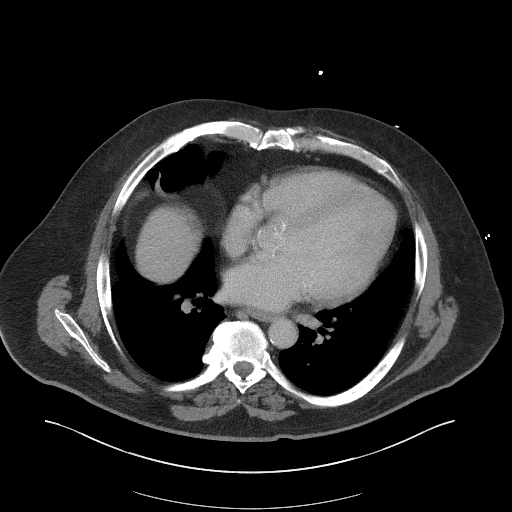

[Series 6: coronal st · coronal · 0.96mm/px · 3 of 125 slices shown]
[im 42/125  soft-tissue]
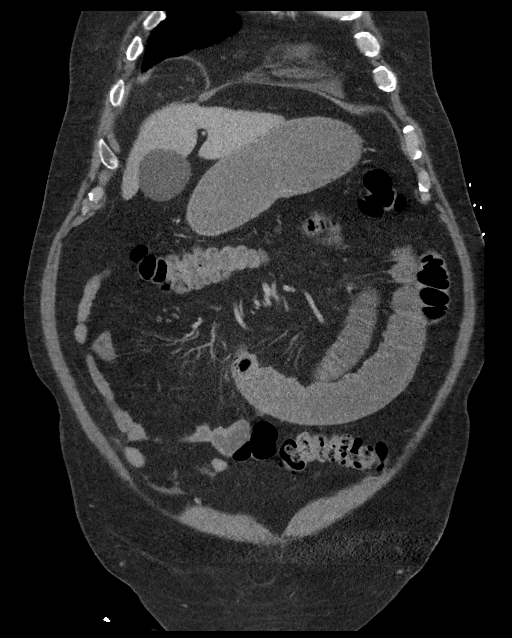
[im 56/125  soft-tissue]
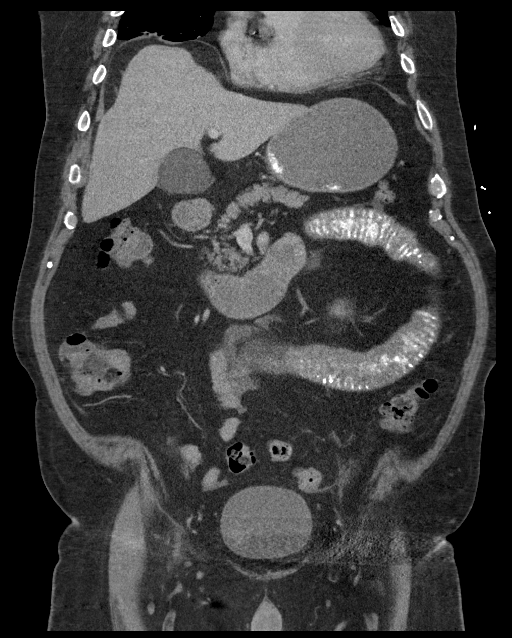
[im 69/125  soft-tissue]
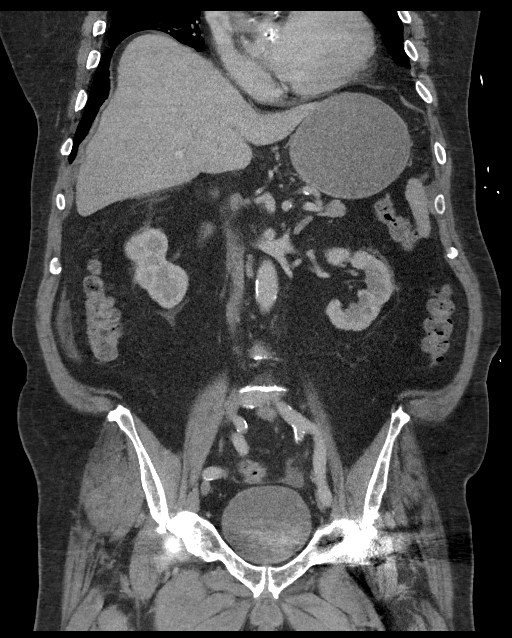

[16 of 46 positions shown; findings below may reference images not displayed]

FINDINGS: Lower chest:  No acute findings.

Hepatobiliary: No masses or other significant abnormality.

Pancreas: No mass, inflammatory changes, or other significant
abnormality.

Spleen: Within normal limits in size and appearance.

Adrenals/Urinary Tract: Small hypodensities likely representing
cysts Are noted bilaterally measuring 1 cm less with the exception
of an exophytic cyst arising from the posterior medial aspect of the
right kidney which measures 2 cm. No calculi or obstructive changes
are noted. The bladder is well distended.

Stomach/Bowel: The stomach is distended with fluid. Proximal small
bowel dilatation is noted. This extends into the proximal ileum and
a relative transition zone is noted in the right mid abdomen on
image number 71 of series 2 although no lesion is seen. The distal
ileum is within normal limits. The colon is unremarkable. The
appendix is within normal limits.

Vascular/Lymphatic: No pathologically enlarged lymph nodes. No
evidence of abdominal aortic aneurysm.

Reproductive: No mass or other significant abnormality.

Other: Minimal amount of free pelvic fluid is noted of uncertain
significance but may be related to the small bowel dilatation. No
free air is seen.

Musculoskeletal: No acute bony abnormality is noted. Left hip
replacement is seen.
IMPRESSION: Mild small bowel dilatation within the jejunum and proximal ileum
without focal obstructing mass lesion. This may represent some mild
enteritis or small-bowel ileus correlation with the physical exam is
recommended.

Scattered renal hypodensities likely representing cysts.

No other significant abnormality is noted.

## 2016-08-28 IMAGING — DX DG CHEST 1V PORT
1 series · 1 of 1 positions shown · non-contrast
Comparison: December 07, 2015

CLINICAL DATA: Shortness of breath with vomiting

EXAM:
PORTABLE CHEST 1 VIEW

[chest ap]
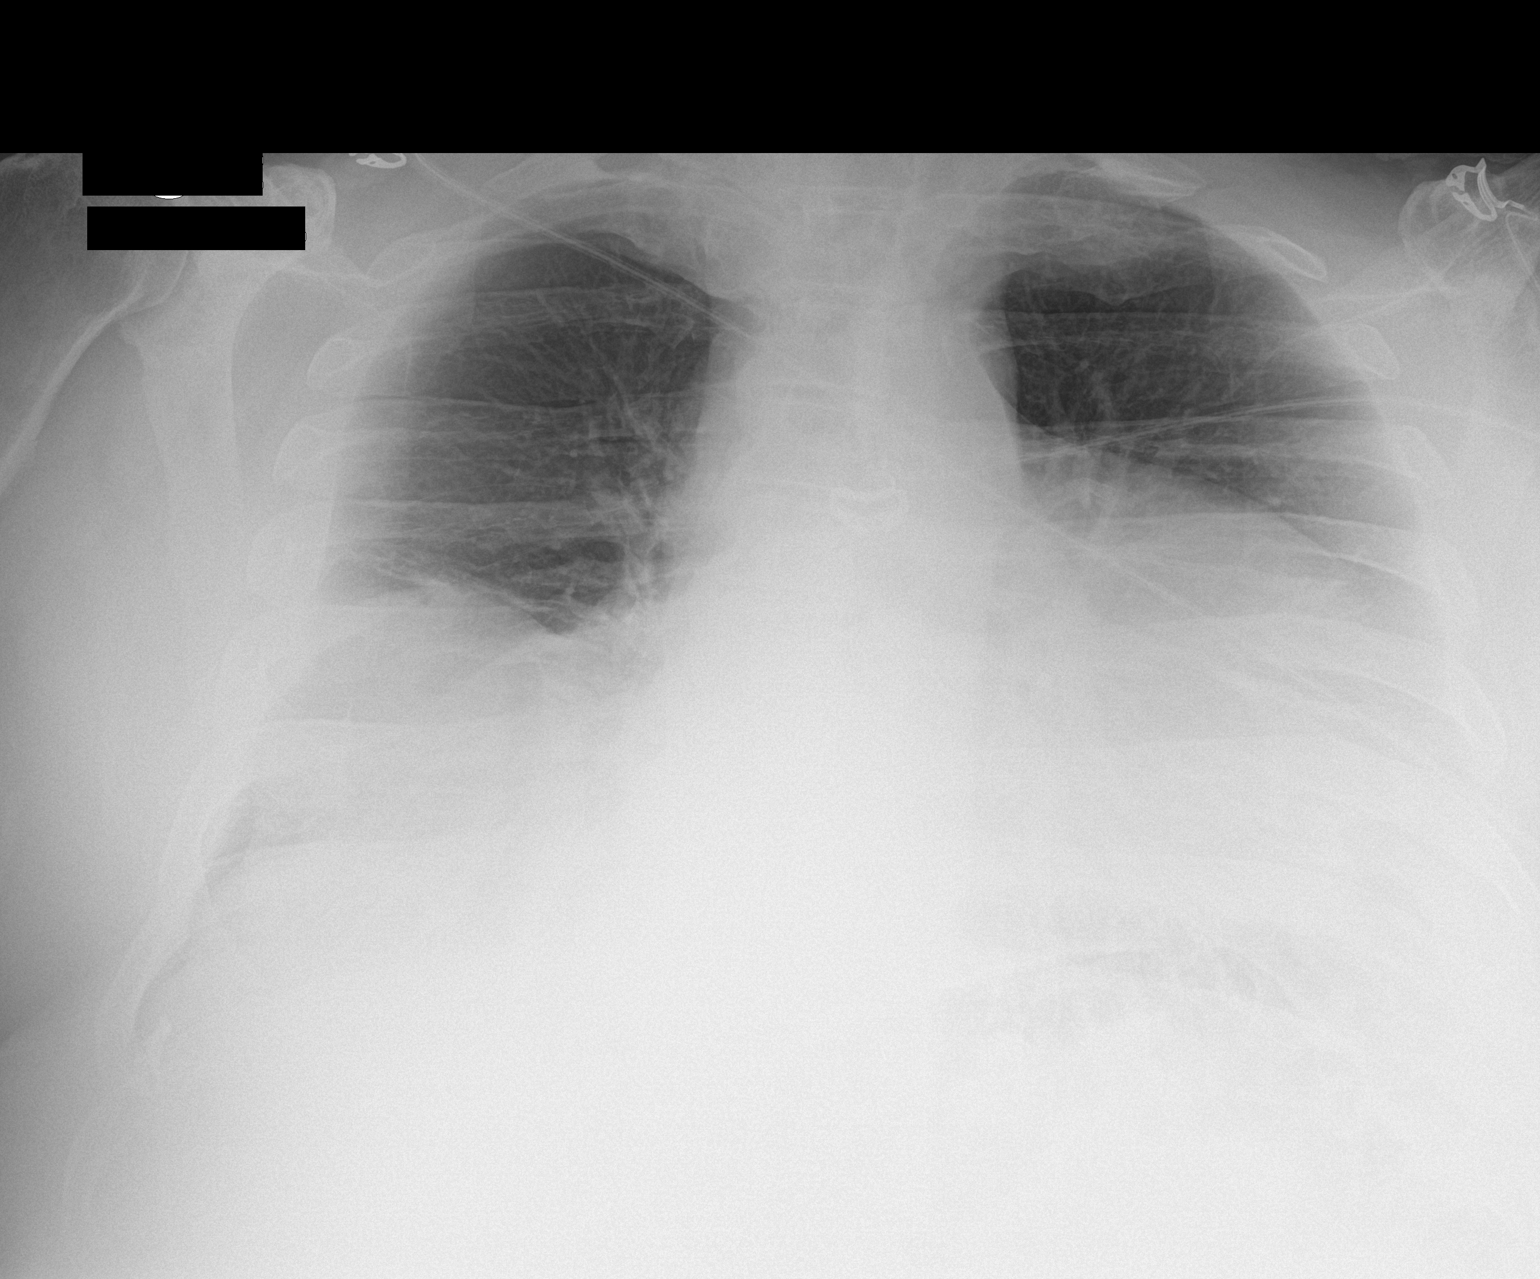

[1 of 1 positions shown; findings below may reference images not displayed]

FINDINGS: There is stable eventration of the right hemidiaphragm. There is
atelectasis in the right lower lobe. The lungs elsewhere are clear.
Heart is mildly enlarged with pulmonary vascular within normal
limits. No adenopathy. No bone lesions.
IMPRESSION: Right lower lobe atelectasis. No edema or consolidation. Stable
cardiac prominence. Stable eventration of the right hemidiaphragm.

## 2016-08-29 IMAGING — CR DG ABDOMEN 1V
2 series · 2 of 2 positions shown · non-contrast
Comparison: 01/26/2016 CT scan abdomen and pelvis.

CLINICAL DATA: Abdominal pain, possible ileus, post cardiac
catheterization 01/05/2016

EXAM:
ABDOMEN - 1 VIEW

[abdomen kub (1 of 2)]
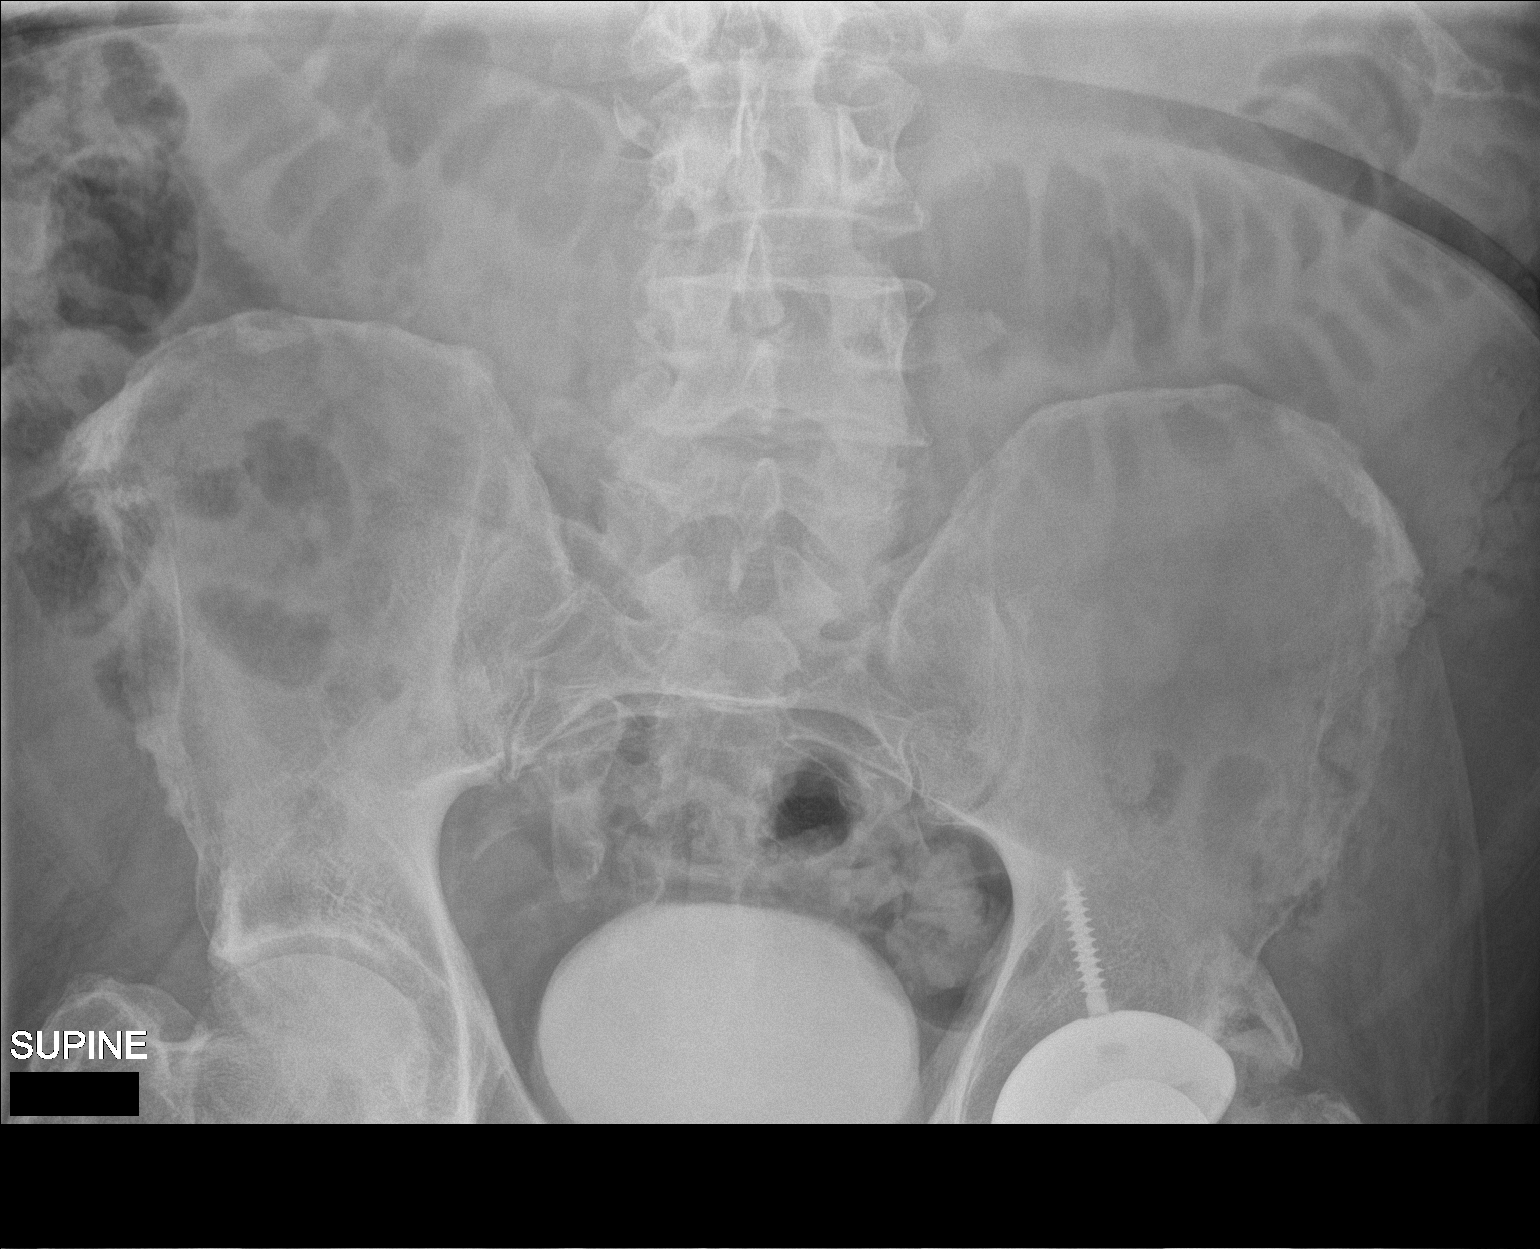

[abdomen kub (2 of 2)]
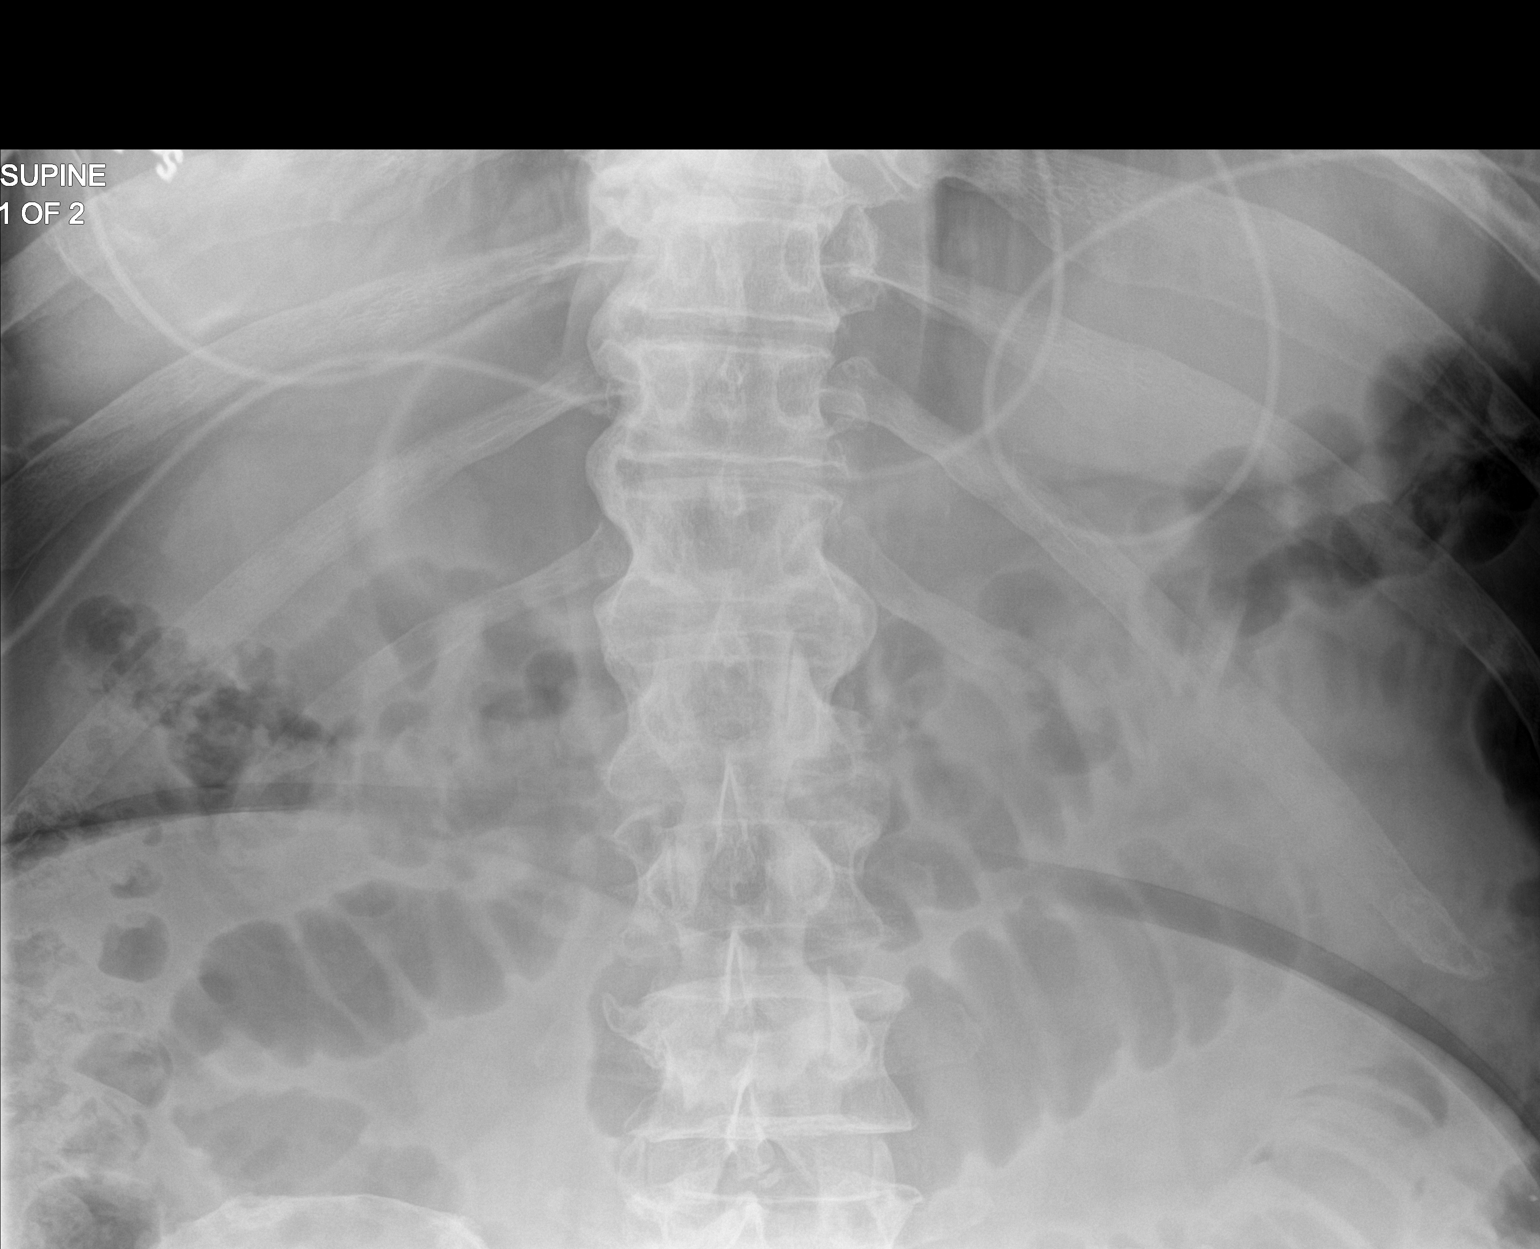

[2 of 2 positions shown; findings below may reference images not displayed]

FINDINGS: There is mild gaseous distension of the proximal small bowel in mid
upper abdomen suspicious for ileus, enteritis or early bowel
obstruction. Contrast material from recent CT scan noted within
urinary bladder. Partially visualized left hip prosthesis.
IMPRESSION: Mild gaseous distention proximal small bowel mid upper abdomen
suspicious for ileus, enteritis or early bowel obstruction.
